# Patient Record
Sex: Female | Born: 1955 | Race: White | Hispanic: No | Smoking: Never smoker
Health system: Southern US, Community
[De-identification: ages and names within clinical notes are randomized; demographics above are authoritative.]

## PROBLEM LIST (undated history)

## (undated) DIAGNOSIS — J45909 Unspecified asthma, uncomplicated: Secondary | ICD-10-CM

## (undated) DIAGNOSIS — J189 Pneumonia, unspecified organism: Secondary | ICD-10-CM

## (undated) HISTORY — DX: Pneumonia, unspecified organism: J18.9

## (undated) HISTORY — DX: Unspecified asthma, uncomplicated: J45.909

---
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
## 1993-10-14 HISTORY — PX: BREAST BIOPSY: SHX20

## 2005-04-05 ENCOUNTER — Other Ambulatory Visit: Admission: RE | Admit: 2005-04-05 | Discharge: 2005-04-05 | Payer: Self-pay | Admitting: Obstetrics and Gynecology

## 2006-05-30 ENCOUNTER — Ambulatory Visit (HOSPITAL_COMMUNITY): Admission: RE | Admit: 2006-05-30 | Discharge: 2006-05-30 | Payer: Self-pay | Admitting: Obstetrics and Gynecology

## 2006-05-30 ENCOUNTER — Encounter (INDEPENDENT_AMBULATORY_CARE_PROVIDER_SITE_OTHER): Payer: Self-pay | Admitting: Specialist

## 2007-05-25 ENCOUNTER — Encounter: Admission: RE | Admit: 2007-05-25 | Discharge: 2007-05-25 | Payer: Self-pay | Admitting: Obstetrics and Gynecology

## 2008-08-03 ENCOUNTER — Encounter: Admission: RE | Admit: 2008-08-03 | Discharge: 2008-08-03 | Payer: Self-pay | Admitting: Obstetrics and Gynecology

## 2009-09-13 ENCOUNTER — Encounter: Admission: RE | Admit: 2009-09-13 | Discharge: 2009-09-13 | Payer: Self-pay | Admitting: Obstetrics and Gynecology

## 2011-03-01 NOTE — Op Note (Signed)
NAMESERRENA, LINDERMAN NO.:  [id number]   MEDICAL RECORD NO.:  [id number]          PATIENT TYPE:  AMB   LOCATION:  SDC                           FACILITY:  [facility]   PHYSICIAN:  Miguel Aschoff, M.D.       DATE OF BIRTH:  [birth year]   DATE OF PROCEDURE:  05/30/2006  DATE OF DISCHARGE:                                 OPERATIVE REPORT   PREOPERATIVE DIAGNOSIS:  Menorrhagia.   POSTOPERATIVE DIAGNOSIS:  Menorrhagia.   PROCEDURE:  1. Cervical dilatation.  2. Hysteroscopy.  3. Uterine curettage.  4. NovaSure endometrial ablation.   SURGEON:  Miguel Aschoff, M.D.   ANESTHESIA:  General.   COMPLICATIONS:  None.   JUSTIFICATION:  The patient is a 55 year old white female with a history of  menorrhagia, getting progressively worse and not responsive to medical  therapy.  The patient has expressed desire to undergo procedure in an effort  to reduce to eliminate her heavy vaginal bleeding and has given her informed  consent for NovaSure endometrial ablation.  The risks and benefits of this  procedure were discussed with the patient.   DESCRIPTION OF PROCEDURE:  The patient was taken to the operating room and  placed in the supine position and general anesthesia was administered  without difficulty.  She was then placed in the dorsal lithotomy position  and prepped and draped in the usual sterile fashion.  The bladder was  catheterized.  Examination revealed normal external genitalia, normal  Bartholin and Skene glands, and normal urethra.  The vaginal vault was  without gross lesion.  The cervix was without gross lesion.  The uterus  appeared to be normal size and shape, mid position.  No adnexal masses were  noted.   At this point a speculum was placed in the vaginal vault.  The anterior  cervical lip was grasped with a tenaculum, and then the uterine cavity was  sounded to 9 cm.  The cervical length of 3.5 cm was then established  __________ 5.5 cm.  Once this was done,  the diagnostic hysteroscope was  entered into the endocervical canal.  No lesions were noted in the canal.  The cavity was then inspected.  There were no significant __________ or  polyps found.  At this point the hysteroscopy was completed.  Uterine  curettage was carried out.  The tissue was removed and sent for histologic  study.  At this point the NovaSure endometrial ablation unit was advanced  through the cervix.  The array opened and a cavity width of 3.5 cm was  established.  After cavity assessment, a treatment cycle of 1 minute 17  seconds at 106 watts was carried out without difficulty.  At the completion  of the treatment cycle the hysteroscope was replaced into the uterine  cavity.  The cavity appeared to be blanched and totally ablated with the  treatment.   At this point, all instruments were removed.  Prior to removing the  speculum, the cervix was injected with 20 mL of 1% Xylocaine by placing  equal amounts in the 12, 4,  and 8 o'clock positions.  Endometrial curettings  were sent for histologic study.   The patient was then taken to the recovery room in satisfactory condition.  She will be discharged home.   MEDICATIONS FOR HOME INCLUDE:  1. Doxycycline 100 mg twice a day x3 days.  2. Darvocet-N 100 q.4 h. as needed for pain.   The patient is instructed to place nothing in the vagina.  To call if there  are any problems such as fever, pain, or heavy bleeding.  She will be seen  back in 4 weeks for her followup examination.      Miguel Aschoff, M.D.  Electronically Signed     AR/MEDQ  D:  05/30/2006  T:  05/30/2006  Job:  [id number]

## 2014-01-04 ENCOUNTER — Encounter: Payer: Self-pay | Admitting: Emergency Medicine

## 2014-01-04 ENCOUNTER — Ambulatory Visit (INDEPENDENT_AMBULATORY_CARE_PROVIDER_SITE_OTHER): Payer: BC Managed Care – PPO | Admitting: Emergency Medicine

## 2014-01-04 VITALS — BP 110/62 | HR 100 | Temp 98.6°F | Resp 16 | Ht 68.0 in | Wt 153.0 lb

## 2014-01-04 DIAGNOSIS — R0609 Other forms of dyspnea: Secondary | ICD-10-CM

## 2014-01-04 DIAGNOSIS — J189 Pneumonia, unspecified organism: Secondary | ICD-10-CM

## 2014-01-04 DIAGNOSIS — R0989 Other specified symptoms and signs involving the circulatory and respiratory systems: Secondary | ICD-10-CM

## 2014-01-04 DIAGNOSIS — R0600 Dyspnea, unspecified: Secondary | ICD-10-CM

## 2014-01-04 DIAGNOSIS — K219 Gastro-esophageal reflux disease without esophagitis: Secondary | ICD-10-CM

## 2014-01-04 MED ORDER — CEFTRIAXONE SODIUM 1 G IJ SOLR
1.0000 g | Freq: Once | INTRAMUSCULAR | Status: AC
  Administered 2014-01-04: 1 g via INTRAMUSCULAR

## 2014-01-04 MED ORDER — ALBUTEROL SULFATE HFA 108 (90 BASE) MCG/ACT IN AERS: 2.0000 | INHALATION_SPRAY | Freq: Four times a day (QID) | RESPIRATORY_TRACT | Status: DC | PRN

## 2014-01-04 MED ORDER — DOXYCYCLINE HYCLATE 100 MG PO TABS: 100.0000 mg | ORAL_TABLET | Freq: Two times a day (BID) | ORAL | Status: DC

## 2014-01-04 MED ORDER — PREDNISONE 10 MG PO TABS: ORAL_TABLET | ORAL | Status: DC

## 2014-01-04 NOTE — Patient Instructions (Signed)
Diet for Gastroesophageal Reflux Disease, Adult Reflux (acid reflux) is when acid from your stomach flows up into the esophagus. When acid comes in contact with the esophagus, the acid causes irritation and soreness (inflammation) in the esophagus. When reflux happens often or so severely that it causes damage to the esophagus, it is called gastroesophageal reflux disease (GERD). Nutrition therapy can help ease the discomfort of GERD. FOODS OR DRINKS TO AVOID OR LIMIT  Smoking or chewing tobacco. Nicotine is one of the most potent stimulants to acid production in the gastrointestinal tract.  Caffeinated and decaffeinated coffee and black tea.  Regular or low-calorie carbonated beverages or energy drinks (caffeine-free carbonated beverages are allowed).   Strong spices, such as black pepper, white pepper, red pepper, cayenne, curry powder, and chili powder.  Peppermint or spearmint.  Chocolate.  High-fat foods, including meats and fried foods. Extra added fats including oils, butter, salad dressings, and nuts. Limit these to less than 8 tsp per day.  Fruits and vegetables if they are not tolerated, such as citrus fruits or tomatoes.  Alcohol.  Any food that seems to aggravate your condition. If you have questions regarding your diet, call your caregiver or a registered dietitian. OTHER THINGS THAT MAY HELP GERD INCLUDE:   Eating your meals slowly, in a relaxed setting.  Eating 5 to 6 small meals per day instead of 3 large meals.  Eliminating food for a period of time if it causes distress.  Not lying down until 3 hours after eating a meal.  Keeping the head of your bed raised 6 to 9 inches (15 to 23 cm) by using a foam wedge or blocks under the legs of the bed. Lying flat may make symptoms worse.  Being physically active. Weight loss may be helpful in reducing reflux in overweight or obese adults.  Wear loose fitting clothing EXAMPLE MEAL PLAN This meal plan is approximately  2,000 calories based on https://www.bernard.org/ meal planning guidelines. Breakfast   cup cooked oatmeal.  1 cup strawberries.  1 cup low-fat milk.  1 oz almonds. Snack  1 cup cucumber slices.  6 oz yogurt (made from low-fat or fat-free milk). Lunch  2 slice whole-wheat bread.  2 oz sliced [place].  2 tsp mayonnaise.  1 cup blueberries.  1 cup snap peas. Snack  6 whole-wheat crackers.  1 oz string cheese. Dinner   cup brown rice.  1 cup mixed veggies.  1 tsp olive oil.  3 oz grilled fish. Document Released: 09/30/2005 Document Revised: 12/23/2011 Document Reviewed: 08/16/2011 [facility] Patient Information 2014 [place], [place]. Pneumonia, Adult Pneumonia is an infection of the lungs. It may be caused by a germ (virus or bacteria). Some types of pneumonia can spread easily from person to person. This can happen when you cough or sneeze. HOME CARE  Only take medicine as told by your doctor.  Take your medicine (antibiotics) as told. Finish it even if you start to feel better.  Do not smoke.  You may use a vaporizer or humidifier in your room. This can help loosen thick spit (mucus).  Sleep so you are almost sitting up (semi-upright). This helps reduce coughing.  Rest. A shot (vaccine) can help prevent pneumonia. Shots are often advised for:  People over 9 years old.  Patients on chemotherapy.  People with long-term (chronic) lung problems.  People with immune system problems. GET HELP RIGHT AWAY IF:   You are getting worse.  You cannot control your cough, and you are losing sleep.  You cough up blood.  Your pain gets worse, even with medicine.  You have a fever.  Any of your problems are getting worse, not better.  You have shortness of breath or chest pain. MAKE SURE YOU:   Understand these instructions.  Will watch your condition.  Will get help right away if you are not doing well or get worse. Document Released: 03/18/2008 Document  Revised: 12/23/2011 Document Reviewed: 12/21/2010 [facility] Patient Information 2014 [place], [place].

## 2014-01-04 NOTE — Progress Notes (Signed)
   Subjective:    Patient ID: Holly Mclean, female    DOB: 1956-08-23, 58 y.o.   MRN: [id number]  HPI Comments: 58 yo WF new patient to establish care needs re-eval for pneumonia. She notes 2 separate xrays at urgent care for CXR with ? Pneumonia. She was treated with ZPAK and cough syrup/ NOse spray w/o relief. She is still with productive cough Green/ Brown. She notes symptoms started after terrible bout of reflux and sorethroat.  She notes mild discomfort in chest with cough.  History was reviewed.   Cough Associated symptoms include shortness of breath.  Shortness of Breath     Medication List       This list is accurate as of: 01/04/14  3:01 PM.  Always use your most recent med list.               brompheniramine-pseudoephedrine-DM 30-2-10 MG/5ML syrup  Take 10 mLs by mouth 4 (four) times daily as needed.          Review of Systems  Constitutional: Positive for fatigue.  HENT: Positive for congestion.   Respiratory: Positive for cough and shortness of breath.   Gastrointestinal:       Reflux  All other systems reviewed and are negative.   BP 110/62  Pulse 100  Temp(Src) 98.6 F (37 C) (Temporal)  Resp 16  Ht 5\' 8"  (1.727 m)  Wt 153 lb (69.4 kg)  BMI 23.27 kg/m2  SpO2 98%     Objective:   Physical Exam  Nursing note and vitals reviewed. Constitutional: She is oriented to person, place, and time. She appears well-developed and well-nourished. No distress.  HENT:  Head: Normocephalic and atraumatic.  Right Ear: External ear normal.  Left Ear: External ear normal.  Nose: Nose normal.  Mouth/Throat: Oropharynx is clear and moist. No oropharyngeal exudate.  Eyes: Conjunctivae and EOM are normal.  Neck: Normal range of motion. Neck supple. No JVD present. No thyromegaly present.  Cardiovascular: Normal rate, regular rhythm, normal heart sounds and intact distal pulses.   Pulmonary/Chest: Effort normal.  ? Rhonchi  Abdominal: Soft. Bowel sounds are  normal. She exhibits no distension and no mass. There is no tenderness. There is no rebound and no guarding.  Musculoskeletal: Normal range of motion. She exhibits no edema and no tenderness.  Lymphadenopathy:    She has no cervical adenopathy.  Neurological: She is alert and oriented to person, place, and time. No cranial nerve deficit.  Skin: Skin is warm and dry. No rash noted. No erythema. No pallor.  Psychiatric: She has a normal mood and affect. Her behavior is normal. Judgment and thought content normal.          Assessment & Plan:  1. Concern for aspiration pneumonia-Rocephin 1 gm in office, Add Doxy 100 mg, Albuterol Hfa, Prednisone DP 10 mg, and Prilosec All AD. F/u 2 days with results may need repeat XRAY vs ER.

## 2014-01-05 ENCOUNTER — Encounter: Payer: Self-pay | Admitting: Emergency Medicine

## 2014-01-05 DIAGNOSIS — J189 Pneumonia, unspecified organism: Secondary | ICD-10-CM

## 2014-01-05 HISTORY — DX: Pneumonia, unspecified organism: J18.9

## 2014-04-11 ENCOUNTER — Ambulatory Visit (INDEPENDENT_AMBULATORY_CARE_PROVIDER_SITE_OTHER): Payer: BC Managed Care – PPO | Admitting: Emergency Medicine

## 2014-04-11 ENCOUNTER — Encounter: Payer: Self-pay | Admitting: Emergency Medicine

## 2014-04-11 ENCOUNTER — Other Ambulatory Visit: Payer: Self-pay | Admitting: Emergency Medicine

## 2014-04-11 VITALS — BP 104/68 | HR 66 | Temp 98.1°F | Resp 16 | Ht 68.5 in | Wt 157.0 lb

## 2014-04-11 DIAGNOSIS — Z79899 Other long term (current) drug therapy: Secondary | ICD-10-CM

## 2014-04-11 DIAGNOSIS — N390 Urinary tract infection, site not specified: Secondary | ICD-10-CM

## 2014-04-11 DIAGNOSIS — Z111 Encounter for screening for respiratory tuberculosis: Secondary | ICD-10-CM

## 2014-04-11 DIAGNOSIS — Z1211 Encounter for screening for malignant neoplasm of colon: Secondary | ICD-10-CM

## 2014-04-11 DIAGNOSIS — R05 Cough: Secondary | ICD-10-CM

## 2014-04-11 DIAGNOSIS — R6889 Other general symptoms and signs: Secondary | ICD-10-CM

## 2014-04-11 DIAGNOSIS — R202 Paresthesia of skin: Secondary | ICD-10-CM

## 2014-04-11 DIAGNOSIS — Z23 Encounter for immunization: Secondary | ICD-10-CM

## 2014-04-11 DIAGNOSIS — Z780 Asymptomatic menopausal state: Secondary | ICD-10-CM

## 2014-04-11 DIAGNOSIS — R059 Cough, unspecified: Secondary | ICD-10-CM

## 2014-04-11 DIAGNOSIS — Z0000 Encounter for general adult medical examination without abnormal findings: Secondary | ICD-10-CM

## 2014-04-11 DIAGNOSIS — E559 Vitamin D deficiency, unspecified: Secondary | ICD-10-CM

## 2014-04-11 DIAGNOSIS — I10 Essential (primary) hypertension: Secondary | ICD-10-CM

## 2014-04-11 LAB — CBC WITH DIFFERENTIAL/PLATELET
BASOS ABS: 0.1 10*3/uL (ref 0.0–0.1)
BASOS PCT: 1 % (ref 0–1)
EOS ABS: 0.1 10*3/uL (ref 0.0–0.7)
Eosinophils Relative: 2 % (ref 0–5)
HCT: 37.4 % (ref 36.0–46.0)
HEMOGLOBIN: 12.7 g/dL (ref 12.0–15.0)
LYMPHS PCT: 56 % — AB (ref 12–46)
Lymphs Abs: 2.9 10*3/uL (ref 0.7–4.0)
MCH: 29.5 pg (ref 26.0–34.0)
MCHC: 34 g/dL (ref 30.0–36.0)
MCV: 86.8 fL (ref 78.0–100.0)
MONO ABS: 0.2 10*3/uL (ref 0.1–1.0)
MONOS PCT: 3 % (ref 3–12)
NEUTROS PCT: 38 % — AB (ref 43–77)
Neutro Abs: 2 10*3/uL (ref 1.7–7.7)
Platelets: 241 10*3/uL (ref 150–400)
RBC: 4.31 MIL/uL (ref 3.87–5.11)
RDW: 13.7 % (ref 11.5–15.5)
WBC: 5.2 10*3/uL (ref 4.0–10.5)

## 2014-04-11 LAB — HEMOGLOBIN A1C
Hgb A1c MFr Bld: 5.5 % (ref <5.7)
MEAN PLASMA GLUCOSE: 111 mg/dL (ref <117)

## 2014-04-11 MED ORDER — MONTELUKAST SODIUM 10 MG PO TABS: 10.0000 mg | ORAL_TABLET | Freq: Every day | ORAL | Status: DC

## 2014-04-11 NOTE — Patient Instructions (Addendum)
Cough, Adult Trial of Singulair x 2 weeks if no change with cough/ raspy voice needs GI evaluation  A cough is a reflex. It helps you clear your throat and airways. A cough can help heal your body. A cough can last 2 or 3 weeks (acute) or may last more than 8 weeks (chronic). Some common causes of a cough can include an infection, allergy, or a cold. HOME CARE  Only take medicine as told by your doctor.  If given, take your medicines (antibiotics) as told. Finish them even if you start to feel better.  Use a cold steam vaporizer or humidier in your home. This can help loosen thick spit (secretions).  Sleep so you are almost sitting up (semi-upright). Use pillows to do this. This helps reduce coughing.  Rest as needed.  Stop smoking if you smoke. GET HELP RIGHT AWAY IF:  You have yellowish-white fluid (pus) in your thick spit.  Your cough gets worse.  Your medicine does not reduce coughing, and you are losing sleep.  You cough up blood.  You have trouble breathing.  Your pain gets worse and medicine does not help.  You have a fever. MAKE SURE YOU:   Understand these instructions.  Will watch your condition.  Will get help right away if you are not doing well or get worse. Document Released: 06/13/2011 Document Revised: 12/23/2011 Document Reviewed: 06/13/2011 [facility] Patient Information 2015 [place], [place]. This information is not intended to replace advice given to you by your health care provider. Make sure you discuss any questions you have with your health care provider.   Food Choices for Gastroesophageal Reflux Disease When you have gastroesophageal reflux disease (GERD), the foods you eat and your eating habits are very important. Choosing the right foods can help ease the discomfort of GERD. WHAT GENERAL GUIDELINES DO I NEED TO FOLLOW?  Choose fruits, vegetables, whole grains, low-fat dairy products, and low-fat meat, fish, and poultry.  Limit fats such as  oils, salad dressings, butter, nuts, and avocado.  Keep a food diary to identify foods that cause symptoms.  Avoid foods that cause reflux. These may be different for different people.  Eat frequent small meals instead of three large meals each day.  Eat your meals slowly, in a relaxed setting.  Limit fried foods.  Cook foods using methods other than frying.  Avoid drinking alcohol.  Avoid drinking large amounts of liquids with your meals.  Avoid bending over or lying down until 2-3 hours after eating. WHAT FOODS ARE NOT RECOMMENDED? The following are some foods and drinks that may worsen your symptoms: Vegetables Tomatoes. Tomato juice. Tomato and spaghetti sauce. Chili peppers. Onion and garlic. Horseradish. Fruits Oranges, grapefruit, and lemon (fruit and juice). Meats High-fat meats, fish, and poultry. This includes hot dogs, ribs, ham, sausage, salami, and bacon. Dairy Whole milk and chocolate milk. Sour cream. Cream. Butter. Ice cream. Cream cheese.  Beverages Coffee and tea, with or without caffeine. Carbonated beverages or energy drinks. Condiments Hot sauce. Barbecue sauce.  Sweets/Desserts Chocolate and cocoa. Donuts. Peppermint and spearmint. Fats and Oils High-fat foods, including [place] fries and potato chips. Other Vinegar. Strong spices, such as black pepper, white pepper, red pepper, cayenne, curry powder, cloves, ginger, and chili powder. The items listed above may not be a complete list of foods and beverages to avoid. Contact your dietitian for more information. Document Released: 09/30/2005 Document Revised: 10/05/2013 Document Reviewed: 08/04/2013 [facility] Patient Information 2015 [place], [place]. This information is not intended  to replace advice given to you by your health care provider. Make sure you discuss any questions you have with your health care provider.  

## 2014-04-11 NOTE — Progress Notes (Signed)
Subjective:    Patient ID: Holly HansenVickie L Mclean, female    DOB: [birth year], 58 y.o.   MRN: [id number]  HPI Comments: 58 yo WF CPE. She has history of asthma as child and concerns for questionable bronchitis/ pneumonia. She notes chronic hoarseness but denies reflux/ allergy drainage. She notes inhaler from previous infection helped with symptoms of chest tightness.   She notes numbness in hands only when rides bike. She is riding routinely for exercise. She denies any injury/ strain.  She is eating healthy for the most part. She is exercising routinely    Medication List       This list is accurate as of: 04/11/14 10:30 AM.  Always use your most recent med list.               albuterol 108 (90 BASE) MCG/ACT inhaler  Commonly known as:  PROVENTIL HFA;VENTOLIN HFA  Inhale 2 puffs into the lungs every 6 (six) hours as needed for wheezing or shortness of breath.       No Known Allergies Past Medical History  Diagnosis Date  . Pneumonia 01/05/2014  . Asthma     child hood   Past Surgical History  Procedure Laterality Date  . Hernia repair      age 89  . Appendectomy    . Tonsillectomy     History  Substance Use Topics  . Smoking status: Never Smoker   . Smokeless tobacco: Not on file  . Alcohol Use: No   Family History  Problem Relation Age of Onset  . Hypertension Father   . Hyperlipidemia Father   . Heart disease Father   . Lupus Maternal Grandmother   . Heart disease Maternal Grandfather   . Heart disease Paternal Grandfather     MAINTENANCE: Colonoscopy: over due Mammo:2013 [facility] WNL BMD: over due Pap/ Pelvic:2013 WNL EYE: 2014 WNL, glasses Dentist:Q 6 month  IMMUNIZATIONS: Tdap:? Pneumovax:2008 Zostavax: Influenza:  Patient Care Team: Lucky CowboyWilliam McKeown, MD as PCP - General (Internal Medicine) [facility] Bjorn LoserMarsha Gruber, MD as Consulting Physician (Dermatology) [place] [facility] Evelena AsaGunner Hayes, (Dentist)   Review of Systems  HENT: Positive  for voice change.   Respiratory: Positive for cough.   Neurological: Positive for numbness.  All other systems reviewed and are negative.  BP 104/68  Pulse 66  Temp(Src) 98.1 F (36.7 C)  Resp 16  Ht 5' 8.5" (1.74 m)  Wt 157 lb (71.215 kg)  BMI 23.52 kg/m2     Objective:   Physical Exam  Nursing note and vitals reviewed. Constitutional: She is oriented to person, place, and time. She appears well-developed and well-nourished. No distress.  HENT:  Head: Normocephalic and atraumatic.  Right Ear: External ear normal.  Left Ear: External ear normal.  Nose: Nose normal.  Mouth/Throat: Oropharynx is clear and moist.  Eyes: Conjunctivae and EOM are normal. Pupils are equal, round, and reactive to light. Right eye exhibits no discharge. Left eye exhibits no discharge. No scleral icterus.  Neck: Normal range of motion. Neck supple. No JVD present. No tracheal deviation present. No thyromegaly present.  Cardiovascular: Normal rate, regular rhythm, normal heart sounds and intact distal pulses.   Pulmonary/Chest: Effort normal and breath sounds normal.  Abdominal: Soft. Bowel sounds are normal. She exhibits no distension and no mass. There is no tenderness. There is no rebound and no guarding.  Genitourinary:  Def GYn  Musculoskeletal: Normal range of motion. She exhibits no edema and no tenderness.  Lymphadenopathy:  She has no cervical adenopathy.  Neurological: She is alert and oriented to person, place, and time. She has normal reflexes. No cranial nerve deficit. She exhibits normal muscle tone. Coordination normal.  Skin: Skin is warm and dry. No rash noted. No erythema. No pallor.  Psychiatric: She has a normal mood and affect. Her behavior is normal. Judgment and thought content normal.     EKG RSR' v1 WNL Aorta scan ? abnormality      Assessment & Plan:  1. CPE- Update screening labs/ History/ Immunizations/ Testing as needed. Advised healthy diet, QD exercise, increase  H20 and continue RX/ Vitamins AD. Need to get U/S Abdomen to evaluate abnormal scan  2. Cough- ? ASthma hx vs reflux vs allergies- Get CXR Trial of Singulair x 2 weeks if no change with cough/ raspy voice needs GI evaluation  3. Hand numbness- Check labs, get bike grips adjusted

## 2014-04-12 ENCOUNTER — Encounter: Payer: Self-pay | Admitting: Gastroenterology

## 2014-04-12 ENCOUNTER — Encounter: Payer: Self-pay | Admitting: Emergency Medicine

## 2014-04-12 ENCOUNTER — Other Ambulatory Visit: Payer: Self-pay | Admitting: Emergency Medicine

## 2014-04-12 DIAGNOSIS — Z1231 Encounter for screening mammogram for malignant neoplasm of breast: Secondary | ICD-10-CM

## 2014-04-12 LAB — BASIC METABOLIC PANEL WITH GFR
BUN: 16 mg/dL (ref 6–23)
CALCIUM: 9.7 mg/dL (ref 8.4–10.5)
CO2: 29 meq/L (ref 19–32)
Chloride: 103 mEq/L (ref 96–112)
Creat: 0.63 mg/dL (ref 0.50–1.10)
GFR, Est African American: >89 mL/min
GLUCOSE: 84 mg/dL (ref 70–99)
Potassium: 4.5 mEq/L (ref 3.5–5.3)
SODIUM: 140 meq/L (ref 135–145)

## 2014-04-12 LAB — URINALYSIS, MICROSCOPIC ONLY
BACTERIA UA: NONE SEEN
CASTS: NONE SEEN
CRYSTALS: NONE SEEN
SQUAMOUS EPITHELIAL / LPF: NONE SEEN

## 2014-04-12 LAB — HEPATIC FUNCTION PANEL
ALT: 17 U/L (ref 0–35)
AST: 22 U/L (ref 0–37)
Albumin: 4.2 g/dL (ref 3.5–5.2)
Alkaline Phosphatase: 88 U/L (ref 39–117)
BILIRUBIN TOTAL: 0.6 mg/dL (ref 0.2–1.2)
Bilirubin, Direct: 0.1 mg/dL (ref 0.0–0.3)
Indirect Bilirubin: 0.5 mg/dL (ref 0.2–1.2)
TOTAL PROTEIN: 7.3 g/dL (ref 6.0–8.3)

## 2014-04-12 LAB — URINALYSIS, ROUTINE W REFLEX MICROSCOPIC
Bilirubin Urine: NEGATIVE
Glucose, UA: NEGATIVE mg/dL
Hgb urine dipstick: NEGATIVE
KETONES UR: NEGATIVE mg/dL
NITRITE: POSITIVE — AB
PROTEIN: NEGATIVE mg/dL
Specific Gravity, Urine: 1.019 (ref 1.005–1.030)
Urobilinogen, UA: 0.2 mg/dL (ref 0.0–1.0)
pH: 6 (ref 5.0–8.0)

## 2014-04-12 LAB — LIPID PANEL
CHOLESTEROL: 218 mg/dL — AB (ref 0–200)
HDL: 57 mg/dL (ref >39)
LDL Cholesterol: 144 mg/dL — ABNORMAL HIGH (ref 0–99)
Total CHOL/HDL Ratio: 3.8 Ratio
Triglycerides: 84 mg/dL (ref <150)
VLDL: 17 mg/dL (ref 0–40)

## 2014-04-12 LAB — IRON AND TIBC
%SAT: 17 % — AB (ref 20–55)
Iron: 64 ug/dL (ref 42–145)
TIBC: 373 ug/dL (ref 250–470)
UIBC: 309 ug/dL (ref 125–400)

## 2014-04-12 LAB — VITAMIN D 25 HYDROXY (VIT D DEFICIENCY, FRACTURES): Vit D, 25-Hydroxy: 31 ng/mL (ref 30–89)

## 2014-04-12 LAB — MAGNESIUM: Magnesium: 2 mg/dL (ref 1.5–2.5)

## 2014-04-12 LAB — MICROALBUMIN / CREATININE URINE RATIO
Creatinine, Urine: 144.8 mg/dL
MICROALB UR: 0.59 mg/dL (ref 0.00–1.89)
Microalb Creat Ratio: 4.1 mg/g (ref 0.0–30.0)

## 2014-04-12 LAB — INSULIN, FASTING: INSULIN FASTING, SERUM: 7 u[IU]/mL (ref 3–28)

## 2014-04-12 LAB — FOLATE RBC: RBC Folate: 506 ng/mL (ref >280)

## 2014-04-12 LAB — TSH: TSH: 3.83 u[IU]/mL (ref 0.350–4.500)

## 2014-04-12 LAB — VITAMIN B12: VITAMIN B 12: 796 pg/mL (ref 211–911)

## 2014-04-14 ENCOUNTER — Other Ambulatory Visit: Payer: Self-pay | Admitting: Emergency Medicine

## 2014-04-14 LAB — URINE CULTURE: Colony Count: >=100000

## 2014-04-14 LAB — TB SKIN TEST
Induration: 0 mm
TB Skin Test: NEGATIVE

## 2014-04-14 MED ORDER — CIPROFLOXACIN HCL 500 MG PO TABS: 500.0000 mg | ORAL_TABLET | Freq: Two times a day (BID) | ORAL | Status: AC

## 2014-04-19 ENCOUNTER — Ambulatory Visit
Admission: RE | Admit: 2014-04-19 | Discharge: 2014-04-19 | Disposition: A | Discharge status: Home / Self Care | Payer: BC Managed Care – PPO | Source: Ambulatory Visit | Attending: Emergency Medicine | Admitting: Emergency Medicine

## 2014-04-19 ENCOUNTER — Other Ambulatory Visit: Payer: Self-pay | Admitting: Emergency Medicine

## 2014-04-19 DIAGNOSIS — R6889 Other general symptoms and signs: Secondary | ICD-10-CM

## 2014-04-22 ENCOUNTER — Ambulatory Visit
Admission: RE | Admit: 2014-04-22 | Discharge: 2014-04-22 | Disposition: A | Discharge status: Home / Self Care | Payer: BC Managed Care – PPO | Source: Ambulatory Visit | Attending: Emergency Medicine | Admitting: Emergency Medicine

## 2014-04-22 DIAGNOSIS — Z780 Asymptomatic menopausal state: Secondary | ICD-10-CM

## 2014-04-22 DIAGNOSIS — Z1231 Encounter for screening mammogram for malignant neoplasm of breast: Secondary | ICD-10-CM

## 2014-04-24 ENCOUNTER — Other Ambulatory Visit: Payer: Self-pay | Admitting: Emergency Medicine

## 2014-04-24 MED ORDER — ALENDRONATE SODIUM 70 MG PO TABS: 70.0000 mg | ORAL_TABLET | ORAL | Status: DC

## 2014-04-27 ENCOUNTER — Encounter: Payer: Self-pay | Admitting: Gastroenterology

## 2014-04-28 ENCOUNTER — Other Ambulatory Visit: Payer: Self-pay | Admitting: Obstetrics and Gynecology

## 2014-04-29 LAB — CYTOLOGY - PAP

## 2014-05-02 ENCOUNTER — Encounter: Payer: Self-pay | Admitting: *Deleted

## 2014-06-13 ENCOUNTER — Encounter: Payer: BC Managed Care – PPO | Admitting: Gastroenterology

## 2014-06-24 ENCOUNTER — Ambulatory Visit (AMBULATORY_SURGERY_CENTER): Payer: Self-pay | Admitting: *Deleted

## 2014-06-24 VITALS — Ht 68.0 in | Wt 156.6 lb

## 2014-06-24 DIAGNOSIS — Z1211 Encounter for screening for malignant neoplasm of colon: Secondary | ICD-10-CM

## 2014-06-24 MED ORDER — MOVIPREP 100 G PO SOLR: ORAL | Status: DC

## 2014-06-24 NOTE — Progress Notes (Signed)
No allergies to eggs or soy. No problems with anesthesia.  Pt given Emmi instructions for colonoscopy  No oxygen use  No diet drug use  

## 2014-06-29 ENCOUNTER — Encounter: Payer: Self-pay | Admitting: Gastroenterology

## 2014-07-15 ENCOUNTER — Encounter: Payer: Self-pay | Admitting: Gastroenterology

## 2014-07-15 ENCOUNTER — Ambulatory Visit (AMBULATORY_SURGERY_CENTER): Payer: BC Managed Care – PPO | Admitting: Gastroenterology

## 2014-07-15 VITALS — BP 113/77 | HR 63 | Temp 98.4°F | Resp 14 | Ht 68.0 in | Wt 156.0 lb

## 2014-07-15 DIAGNOSIS — Z1211 Encounter for screening for malignant neoplasm of colon: Secondary | ICD-10-CM

## 2014-07-15 MED ORDER — SODIUM CHLORIDE 0.9 % IV SOLN: 500.0000 mL | INTRAVENOUS | Status: DC

## 2014-07-15 NOTE — Op Note (Signed)
[facility] [address] [place], [id number]   COLONOSCOPY PROCEDURE REPORT  PATIENT: Holly HansenReid, Kip L  MR#: [id number] BIRTHDATE: Sep 28, 1956 , 58  yrs. old GENDER: female ENDOSCOPIST: Meryl DareMalcolm T Merita Hawks, MD, [facility] REFERRED XB:JYNWGNFBY:McKeown, William PROCEDURE DATE:  07/15/2014 PROCEDURE:   Colonoscopy, screening First Screening Colonoscopy - Avg.  risk and is 50 yrs.  old or older Yes.  Prior Negative Screening - Now for repeat screening. N/A  History of Adenoma - Now for follow-up colonoscopy & has been > or = to 3 yrs.  N/A  Polyps Removed Today? No.  Polyps Removed Today? No.  Recommend repeat exam, <10 yrs? Polyps Removed Today? No.  Recommend repeat exam, <10 yrs? No. ASA CLASS:   Class II INDICATIONS:average risk for colorectal cancer. MEDICATIONS: Monitored anesthesia care and Propofol 200 mg IV DESCRIPTION OF PROCEDURE:   After the risks benefits and alternatives of the procedure were thoroughly explained, informed consent was obtained.  The digital rectal exam revealed no abnormalities of the rectum.   The LB [id number]  endoscope was introduced through the anus and advanced to the cecum, which was identified by both the appendix and ileocecal valve. No adverse events experienced.   The quality of the prep was excellent, using MoviPrep  The instrument was then slowly withdrawn as the colon was fully examined.    COLON FINDINGS: A normal appearing cecum, ileocecal valve, and appendiceal orifice were identified.  The ascending, transverse, descending, sigmoid colon, and rectum appeared unremarkable. Retroflexed views revealed no abnormalities. The time to cecum=3 minutes 29 seconds.  Withdrawal time=9 minutes 10 seconds.  The scope was withdrawn and the procedure completed.  COMPLICATIONS: There were no immediate complications.  ENDOSCOPIC IMPRESSION: 1.  Normal colonoscopy  RECOMMENDATIONS: 1.  Continue to follow colorectal cancer screening guidelines  for "routine risk" patients with a repeat colonoscopy in 10 years. There is no need for routine, screening FOBT (stool) testing for at least 5 years.  eSigned:  Meryl DareMalcolm T Michelyn Scullin, MD, [facility]FACG 07/15/2014 10:09 AM

## 2014-07-15 NOTE — Progress Notes (Signed)
Stable to RR 

## 2014-07-15 NOTE — Patient Instructions (Signed)
YOU HAD AN ENDOSCOPIC PROCEDURE TODAY AT THE [facility]: Refer to the procedure report that was given to you for any specific questions about what was found during the examination.  If the procedure report does not answer your questions, please call your gastroenterologist to clarify.  If you requested that your care partner not be given the details of your procedure findings, then the procedure report has been included in a sealed envelope for you to review at your convenience later.  YOU SHOULD EXPECT: Some feelings of bloating in the abdomen. Passage of more gas than usual.  Walking can help get rid of the air that was put into your GI tract during the procedure and reduce the bloating. If you had a lower endoscopy (such as a colonoscopy or flexible sigmoidoscopy) you may notice spotting of blood in your stool or on the toilet paper. If you underwent a bowel prep for your procedure, then you may not have a normal bowel movement for a few days.  DIET: Your first meal following the procedure should be a light meal and then it is ok to progress to your normal diet.  A half-sandwich or bowl of soup is an example of a good first meal.  Heavy or fried foods are harder to digest and may make you feel nauseous or bloated.  Likewise meals heavy in dairy and vegetables can cause extra gas to form and this can also increase the bloating.  Drink plenty of fluids but you should avoid alcoholic beverages for 24 hours.  ACTIVITY: Your care partner should take you home directly after the procedure.  You should plan to take it easy, moving slowly for the rest of the day.  You can resume normal activity the day after the procedure however you should NOT DRIVE or use heavy machinery for 24 hours (because of the sedation medicines used during the test).    SYMPTOMS TO REPORT IMMEDIATELY: A gastroenterologist can be reached at any hour.  During normal business hours, 8:30 AM to 5:00 PM Monday through Friday,  call [phone].  After hours and on weekends, please call the GI answering service at [phone] who will take a message and have the physician on call contact you.   Following lower endoscopy (colonoscopy or flexible sigmoidoscopy):  Excessive amounts of blood in the stool  Significant tenderness or worsening of abdominal pains  Swelling of the abdomen that is new, acute  Fever of 100F or higher  FOLLOW UP: If any biopsies were taken you will be contacted by phone or by letter within the next 1-3 weeks.  Call your gastroenterologist if you have not heard about the biopsies in 3 weeks.  Our staff will call the home number listed on your records the next business day following your procedure to check on you and address any questions or concerns that you may have at that time regarding the information given to you following your procedure. This is a courtesy call and so if there is no answer at the home number and we have not heard from you through the emergency physician on call, we will assume that you have returned to your regular daily activities without incident.  SIGNATURES/CONFIDENTIALITY: You and/or your care partner have signed paperwork which will be entered into your electronic medical record.  These signatures attest to the fact that that the information above on your After Visit Summary has been reviewed and is understood.  Full responsibility of the confidentiality of this   discharge information lies with you and/or your care-partner.  Recommendations Next colonoscopy in 10 years. 

## 2014-07-18 ENCOUNTER — Telehealth: Payer: Self-pay | Admitting: *Deleted

## 2014-07-18 NOTE — Telephone Encounter (Signed)
  Follow up Call-  Call back number 07/15/2014  Post procedure Call Back phone  # [phone]  Permission to leave phone message Yes     Patient questions:  Do you have a fever, pain , or abdominal swelling? No. Pain Score  0 *  Have you tolerated food without any problems? Yes.    Have you been able to return to your normal activities? Yes.    Do you have any questions about your discharge instructions: Diet   No. Medications  No. Follow up visit  No.  Do you have questions or concerns about your Care? No.  Actions: * If pain score is 4 or above: No action needed, pain <4.

## 2014-07-22 ENCOUNTER — Ambulatory Visit: Payer: Self-pay | Admitting: Physician Assistant

## 2015-04-12 ENCOUNTER — Encounter: Payer: Self-pay | Admitting: Emergency Medicine

## 2015-04-13 ENCOUNTER — Encounter: Payer: Self-pay | Admitting: Physician Assistant

## 2016-01-12 ENCOUNTER — Other Ambulatory Visit: Payer: Self-pay

## 2016-01-12 DIAGNOSIS — Z1231 Encounter for screening mammogram for malignant neoplasm of breast: Secondary | ICD-10-CM

## 2016-01-29 ENCOUNTER — Ambulatory Visit
Admission: RE | Admit: 2016-01-29 | Discharge: 2016-01-29 | Disposition: A | Discharge status: Home / Self Care | Payer: BLUE CROSS/BLUE SHIELD | Source: Ambulatory Visit

## 2016-01-29 DIAGNOSIS — Z1231 Encounter for screening mammogram for malignant neoplasm of breast: Secondary | ICD-10-CM

## 2020-07-04 ENCOUNTER — Other Ambulatory Visit: Payer: Self-pay | Admitting: Internal Medicine

## 2020-07-04 DIAGNOSIS — Z1231 Encounter for screening mammogram for malignant neoplasm of breast: Secondary | ICD-10-CM

## 2020-07-05 ENCOUNTER — Other Ambulatory Visit: Payer: Self-pay

## 2020-07-05 ENCOUNTER — Ambulatory Visit
Admission: RE | Admit: 2020-07-05 | Discharge: 2020-07-05 | Disposition: A | Discharge status: Home / Self Care | Payer: BC Managed Care – PPO | Source: Ambulatory Visit | Attending: Internal Medicine | Admitting: Internal Medicine

## 2020-07-05 DIAGNOSIS — Z1231 Encounter for screening mammogram for malignant neoplasm of breast: Secondary | ICD-10-CM

## 2021-05-22 ENCOUNTER — Other Ambulatory Visit: Payer: Self-pay | Admitting: Internal Medicine

## 2021-05-22 DIAGNOSIS — Z1231 Encounter for screening mammogram for malignant neoplasm of breast: Secondary | ICD-10-CM

## 2021-06-29 DIAGNOSIS — H2513 Age-related nuclear cataract, bilateral: Secondary | ICD-10-CM | POA: Diagnosis not present

## 2021-06-29 DIAGNOSIS — H43393 Other vitreous opacities, bilateral: Secondary | ICD-10-CM | POA: Diagnosis not present

## 2021-07-12 ENCOUNTER — Ambulatory Visit
Admission: RE | Admit: 2021-07-12 | Discharge: 2021-07-12 | Disposition: A | Discharge status: Home / Self Care | Payer: Medicare Other | Source: Ambulatory Visit

## 2021-07-12 ENCOUNTER — Other Ambulatory Visit: Payer: Self-pay

## 2021-07-12 DIAGNOSIS — Z1231 Encounter for screening mammogram for malignant neoplasm of breast: Secondary | ICD-10-CM

## 2022-04-02 ENCOUNTER — Encounter: Payer: Self-pay | Admitting: Nurse Practitioner

## 2022-04-02 ENCOUNTER — Ambulatory Visit (INDEPENDENT_AMBULATORY_CARE_PROVIDER_SITE_OTHER): Payer: Medicare Other | Admitting: Nurse Practitioner

## 2022-04-02 VITALS — BP 152/98 | HR 78 | Temp 96.8°F | Wt 191.0 lb

## 2022-04-02 DIAGNOSIS — I10 Essential (primary) hypertension: Secondary | ICD-10-CM

## 2022-04-02 DIAGNOSIS — E559 Vitamin D deficiency, unspecified: Secondary | ICD-10-CM

## 2022-04-02 DIAGNOSIS — Z1329 Encounter for screening for other suspected endocrine disorder: Secondary | ICD-10-CM | POA: Diagnosis not present

## 2022-04-02 DIAGNOSIS — Z131 Encounter for screening for diabetes mellitus: Secondary | ICD-10-CM

## 2022-04-02 DIAGNOSIS — E538 Deficiency of other specified B group vitamins: Secondary | ICD-10-CM

## 2022-04-02 DIAGNOSIS — E669 Obesity, unspecified: Secondary | ICD-10-CM | POA: Diagnosis not present

## 2022-04-02 DIAGNOSIS — R7309 Other abnormal glucose: Secondary | ICD-10-CM | POA: Diagnosis not present

## 2022-04-02 DIAGNOSIS — F419 Anxiety disorder, unspecified: Secondary | ICD-10-CM

## 2022-04-02 LAB — CBC WITH DIFFERENTIAL/PLATELET
Absolute Monocytes: 282 cells/uL (ref 200–950)
Eosinophils Absolute: 147 cells/uL (ref 15–500)
Eosinophils Relative: 2.3 %
HCT: 42.6 % (ref 35.0–45.0)
Hemoglobin: 14.5 g/dL (ref 11.7–15.5)
Lymphs Abs: 2566 cells/uL (ref 850–3900)
MCV: 88.4 fL (ref 80.0–100.0)
MPV: 11.2 fL (ref 7.5–12.5)
Platelets: 237 10*3/uL (ref 140–400)
Total Lymphocyte: 40.1 %

## 2022-04-02 MED ORDER — BUSPIRONE HCL 5 MG PO TABS: ORAL_TABLET | ORAL | 0 refills | Status: DC

## 2022-04-02 NOTE — Progress Notes (Signed)
FOLLOW UP  Assessment and Plan:   Elevated blood pressure reading in office with diagnosis of hypertension Check BP in home. If consistently >130/90 RTC for further evaluation and adjustment to POC. Report to ER for any increase in stroke like symptoms, including HA, N/V, paralysis, difficulty speaking, trouble walking, confusion, vision changes, CP, heart palpitations, SOB, diaphoresis.  - CBC with Differential/Platelet - COMPLETE METABOLIC PANEL WITH GFR  Obesity (BMI 30-39.9) Discussed appropriate BMI Goal of losing 1 lb per month. Diet modification, suggested Mediterranean diet. Encouraged Physical activity. Encouraged/praised to build confidence.  - Lipid panel - TSH - Hemoglobin A1c   Vitamin D deficiency  - VITAMIN D 25 Hydroxy (Vit-D Deficiency, Fractures)  Vitamin B 12 deficiency  - Vitamin B12  Screening for thyroid disorder  - TSH  Anxiety Start Buspar  Continue to monitor   Continue diet and meds as discussed. Further disposition pending results of labs. Discussed med's effects and SE's.   Over 30 minutes of exam, counseling, chart review, and critical decision making was performed.   Future Appointments  Date Time Provider Department Center  05/08/2022 10:30 AM Adela Glimpse, NP [facility] None  04/03/2023 10:00 AM Adela Glimpse, NP [facility] None    ----------------------------------------------------------------------------------------------------------------------  HPI  66 y.o. female  presents for a follow up after not being seen in the last two years due to taking care of her mother.  Her mother has since past approximately 15 mo ago.  She is upset that she has gained weight. Her mood is stable, however, she remains anxious daily and at times does not want to go inside the home where she lives and took care of her mother.  She has trouble sleeping and takes Melatonin PRN but this givers her nightmares.  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   She tried to retire last year but her daughter works with her and wanted her to work for one more year.  She is here to follow up on hypertension, cholesterol, diabetes, weight and vitamin D deficiency.   She is UTD on her mammogram, due in one year. Reports no evidence of malignancy.  She is UTD on her Colonoscopy, due 2025.  She is UTD on DEXA.   BMI is Body mass index is 29.04 kg/m., she has not been working on diet and exercise. Wt Readings from Last 3 Encounters:  04/02/22 191 lb (86.6 kg)  07/15/14 156 lb (70.8 kg)  06/24/14 156 lb 9.6 oz (71 kg)    Her blood pressure has not been controlled at home, today their BP is BP: (!) 152/98  She does workout. She denies chest pain, shortness of breath, dizziness.   She is not on cholesterol medication . Her cholesterol is not at goal. The cholesterol last visit was:   Lab Results  Component Value Date   CHOL 218 (H) 04/11/2014   HDL 57 04/11/2014   LDLCALC 144 (H) 04/11/2014   TRIG 84 04/11/2014   CHOLHDL 3.8 04/11/2014    She has not been working on diet and exercise for prediabetes, and denies hyperglycemia and hypoglycemia . Last A1C in the office was:  Lab Results  Component Value Date   HGBA1C 5.5 04/11/2014   Patient is on Vitamin D supplement.   Lab Results  Component Value Date   VD25OH 31 04/11/2014        Current Medications:  Current Outpatient Medications on File Prior to Visit  Medication Sig   albuterol (PROVENTIL HFA;VENTOLIN HFA) 108 (90  BASE) MCG/ACT inhaler Inhale 2 puffs into the lungs every 6 (six) hours as needed for wheezing or shortness of breath.   alendronate (FOSAMAX) 70 MG tablet Take 1 tablet (70 mg total) by mouth every 7 (seven) days. Take with a full glass of water on an empty stomach. (Patient not taking: Reported on 04/02/2022)   MELATONIN ER PO Take by mouth at bedtime as needed. (Patient not taking: Reported on 04/02/2022)   No current facility-administered medications on file prior to  visit.     Allergies: No Known Allergies   Medical History:  Past Medical History:  Diagnosis Date   Asthma    child hood   Pneumonia 01/05/2014   Family history- Reviewed and unchanged Social history- Reviewed and unchanged   Review of Systems:  ROS    Physical Exam: BP (!) 152/98   Pulse 78   Temp (!) 96.8 F (36 C)   Wt 191 lb (86.6 kg)   SpO2 99%   BMI 29.04 kg/m  Wt Readings from Last 3 Encounters:  04/02/22 191 lb (86.6 kg)  07/15/14 156 lb (70.8 kg)  06/24/14 156 lb 9.6 oz (71 kg)   General Appearance: Well nourished, in no apparent distress. Eyes: PERRLA, EOMs, conjunctiva no swelling or erythema Sinuses: No Frontal/maxillary tenderness ENT/Mouth: Ext aud canals clear, TMs without erythema, bulging. No erythema, swelling, or exudate on post pharynx.  Tonsils not swollen or erythematous. Hearing normal.  Neck: Supple, thyroid normal.  Respiratory: Respiratory effort normal, BS equal bilaterally without rales, rhonchi, wheezing or stridor.  Cardio: RRR with no MRGs. Brisk peripheral pulses without edema.  Abdomen: Soft, + BS.  Non tender, no guarding, rebound, hernias, masses. Lymphatics: Non tender without lymphadenopathy.  Musculoskeletal: Full ROM, 5/5 strength, Normal gait Skin: Warm, dry without rashes, lesions, ecchymosis.  Neuro: Cranial nerves intact. No cerebellar symptoms.  Psych: Awake and oriented X 3, normal affect, Insight and Judgment appropriate.    Adela Glimpse, NP 1:43 PM [facility]

## 2022-04-02 NOTE — Patient Instructions (Signed)

## 2022-04-03 ENCOUNTER — Encounter: Payer: Self-pay | Admitting: Nurse Practitioner

## 2022-04-03 LAB — COMPLETE METABOLIC PANEL WITH GFR
AG Ratio: 1.5 (calc) (ref 1.0–2.5)
ALT: 10 U/L (ref 6–29)
AST: 16 U/L (ref 10–35)
Albumin: 4.4 g/dL (ref 3.6–5.1)
Alkaline phosphatase (APISO): 111 U/L (ref 37–153)
BUN: 13 mg/dL (ref 7–25)
CO2: 25 mmol/L (ref 20–32)
Calcium: 9.8 mg/dL (ref 8.6–10.4)
Chloride: 105 mmol/L (ref 98–110)
Creat: 0.86 mg/dL (ref 0.50–1.05)
Globulin: 3 g/dL (calc) (ref 1.9–3.7)
Glucose, Bld: 88 mg/dL (ref 65–99)
Potassium: 4.5 mmol/L (ref 3.5–5.3)
Sodium: 141 mmol/L (ref 135–146)
Total Bilirubin: 0.6 mg/dL (ref 0.2–1.2)
Total Protein: 7.4 g/dL (ref 6.1–8.1)
eGFR: 74 mL/min/{1.73_m2} (ref >=60)

## 2022-04-03 LAB — CBC WITH DIFFERENTIAL/PLATELET
Basophils Absolute: 83 cells/uL (ref 0–200)
Basophils Relative: 1.3 %
MCH: 30.1 pg (ref 27.0–33.0)
MCHC: 34 g/dL (ref 32.0–36.0)
Monocytes Relative: 4.4 %
Neutro Abs: 3322 cells/uL (ref 1500–7800)
Neutrophils Relative %: 51.9 %
RBC: 4.82 10*6/uL (ref 3.80–5.10)
RDW: 12 % (ref 11.0–15.0)
WBC: 6.4 10*3/uL (ref 3.8–10.8)

## 2022-04-03 LAB — VITAMIN D 25 HYDROXY (VIT D DEFICIENCY, FRACTURES): Vit D, 25-Hydroxy: 13 ng/mL — ABNORMAL LOW (ref 30–100)

## 2022-04-03 LAB — LIPID PANEL
Cholesterol: 252 mg/dL — ABNORMAL HIGH (ref <200)
HDL: 54 mg/dL (ref >=50)
LDL Cholesterol (Calc): 171 mg/dL (calc) — ABNORMAL HIGH
Non-HDL Cholesterol (Calc): 198 mg/dL (calc) — ABNORMAL HIGH (ref <130)
Total CHOL/HDL Ratio: 4.7 (calc) (ref <5.0)
Triglycerides: 133 mg/dL (ref <150)

## 2022-04-03 LAB — HEMOGLOBIN A1C
Hgb A1c MFr Bld: 5.3 % of total Hgb (ref <5.7)
Mean Plasma Glucose: 105 mg/dL
eAG (mmol/L): 5.8 mmol/L

## 2022-04-03 LAB — TSH: TSH: 3.07 mIU/L (ref 0.40–4.50)

## 2022-04-03 LAB — VITAMIN B12: Vitamin B-12: 349 pg/mL (ref 200–1100)

## 2022-04-04 ENCOUNTER — Other Ambulatory Visit: Payer: Self-pay | Admitting: Nurse Practitioner

## 2022-04-04 DIAGNOSIS — E669 Obesity, unspecified: Secondary | ICD-10-CM

## 2022-04-04 DIAGNOSIS — E782 Mixed hyperlipidemia: Secondary | ICD-10-CM

## 2022-04-04 DIAGNOSIS — I10 Essential (primary) hypertension: Secondary | ICD-10-CM

## 2022-04-04 MED ORDER — ATORVASTATIN CALCIUM 10 MG PO TABS: 10.0000 mg | ORAL_TABLET | Freq: Every day | ORAL | 11 refills | Status: AC

## 2022-04-04 MED ORDER — WEGOVY 0.25 MG/0.5ML ~~LOC~~ SOAJ: 0.2500 mg | SUBCUTANEOUS | 0 refills | Status: DC

## 2022-05-07 ENCOUNTER — Other Ambulatory Visit: Payer: Self-pay

## 2022-05-07 MED ORDER — BUSPIRONE HCL 5 MG PO TABS: ORAL_TABLET | ORAL | 2 refills | Status: DC

## 2022-05-08 ENCOUNTER — Ambulatory Visit (INDEPENDENT_AMBULATORY_CARE_PROVIDER_SITE_OTHER): Payer: Medicare Other | Admitting: Nurse Practitioner

## 2022-05-08 ENCOUNTER — Encounter: Payer: Self-pay | Admitting: Nurse Practitioner

## 2022-05-08 VITALS — BP 150/96 | HR 74 | Temp 97.7°F | Ht 68.0 in | Wt 189.0 lb

## 2022-05-08 DIAGNOSIS — E669 Obesity, unspecified: Secondary | ICD-10-CM | POA: Diagnosis not present

## 2022-05-08 DIAGNOSIS — Z79899 Other long term (current) drug therapy: Secondary | ICD-10-CM | POA: Diagnosis not present

## 2022-05-08 DIAGNOSIS — I10 Essential (primary) hypertension: Secondary | ICD-10-CM

## 2022-05-08 DIAGNOSIS — T753XXA Motion sickness, initial encounter: Secondary | ICD-10-CM

## 2022-05-08 MED ORDER — OLMESARTAN MEDOXOMIL 5 MG PO TABS: 5.0000 mg | ORAL_TABLET | Freq: Every day | ORAL | 0 refills | Status: DC

## 2022-05-08 MED ORDER — ONDANSETRON 4 MG PO TBDP: 4.0000 mg | ORAL_TABLET | Freq: Three times a day (TID) | ORAL | 0 refills | Status: AC | PRN

## 2022-05-08 NOTE — Patient Instructions (Signed)
Motion Sickness Motion sickness is an unpleasant, temporary feeling of dizziness and nausea that may occur when you are traveling in a moving vehicle. You may also have abdominal pain, sweating, and paleness. You may experience motion sickness while riding in a boat, car, airplane, or even an amusement park ride. What are the causes? This condition may be caused when part of the inner ear (semicircular canal) is stimulated too much. The two canals help you keep your balance by sending signals to your brain about movement. You can get motion sickness if: The vestibular system is stimulated too much from the motion of your body. Your brain gets conflicting signals from the various motion sensors in your body. The effects of motion sickness may be increased by stress, dehydration, other illnesses, or drinking too much alcohol. What increases the risk? This condition is more likely to develop in: Children who are 2-12 years old. Women, especially those who are pregnant or taking birth control pills. People who get migraine headaches. People who have inner ear disorders. People who take certain medicines. What are the signs or symptoms? Symptoms of this condition include: Nausea and vomiting. Dizziness. Paleness. Sweating. Difficulty concentrating. Abdominal pain. Being unsteady when walking. The symptoms of motion sickness usually get better after the motion or traveling stops, but symptoms may last for hours or days. How is this diagnosed? This condition is diagnosed based on a physical exam, your medical history, and your description of the symptoms that you have while traveling or moving. How is this treated? For most people, symptoms fade quickly after the motion stops. Avoiding certain things or using certain techniques before or during travel can help prevent episodes of motion sickness. Your health care provider may recommend or prescribe medicine to help prevent motion sickness.  Medicine may be in the form of a patch that is placed behind your ear. Follow these instructions at home: Medicines Take or use over-the-counter and prescription medicines only as told by your health care provider. If you use a motion sickness patch, wash your hands right after you put the patch on. Touching your hands to your eyes after using the patch can enlarge (dilate) your pupils for 1-2 days and disturb your vision. Eating and drinking  Drink enough fluid to keep your urine pale yellow. Take small, frequent sips of liquids as needed while experiencing motion sickness. Staying hydrated may help relieve or prevent symptoms. Do not eat large meals before or during travel. When you travel long distances, eat small, bland meals. Do not drink alcohol before or during travel. When riding in a moving vehicle: Sit in an area of the vehicle where the least motion occurs. On an airplane, sit near the wing. Lie back in your seat if possible. On a boat, sit near the middle. In a car, sit in the front seat, not the back seat. Breathe slowly and deeply. Do not read or focus on nearby objects such as your mobile phone. Try watching the horizon or a distant object. This is especially helpful when you travel in a boat. In a car, ride in the front seat and look out the front window. Get some fresh air if possible. For example, open a window when you are riding in a car. General instructions If possible, avoid situations that cause your motion sickness. Do not smoke before or during travel. Avoid areas where people are smoking. Plan ahead for travel. Talk with your health care provider about whether you should use medicines to help   prevent motion sickness. Where to find more information Centers for Disease Control and Prevention: wwwnc.cdc.gov Contact a health care provider if: You faint. Your vomiting or nausea does not go away within 24 hours. You have persistent severe dizziness or light-headedness  when you stand up. You have a fever. Get help right away if: You have trouble breathing. You have severe pain in your abdomen or chest. You have a severe headache. You have trouble speaking. You develop weakness or numbness on one side of your body. These symptoms may represent a serious problem that is an emergency. Do not wait to see if the symptoms will go away. Get medical help right away. Call your local emergency services (911 in the [place] Do not drive yourself to the hospital. Summary Motion sickness is an unpleasant, temporary feeling of dizziness and nausea that occurs when traveling in a moving vehicle. You may also have abdominal pain, sweating, and paleness. The symptoms of motion sickness usually get better after the motion or traveling stops, but symptoms may last for hours or days. Plan ahead for travel. Talk with your health care provider about whether you should use medicines to help prevent motion sickness. This information is not intended to replace advice given to you by your health care provider. Make sure you discuss any questions you have with your health care provider. Document Revised: 09/27/2020 Document Reviewed: 09/05/2020 Elsevier Patient Education  2023 [employer]vier Inc[employer]  

## 2022-05-08 NOTE — Progress Notes (Signed)
FOLLOW UP  Assessment and Plan:   Holly Mclean was seen today for a follow up visit.   Diagnoses and all order for this visit:  Hypertension Start Olmesartan 5 mg daily Continue to monitor BP. Goal <130/90 Continue DASH diet.   Reminder to go to the ER if any CP, SOB, nausea, dizziness, severe HA, changes vision/speech, left arm numbness and tingling and jaw pain.   Obesity with co morbidities Discussed appropriate BMI Goal of losing 1 lb per month. Diet modification. Physical activity. Encouraged/praised to build confidence. Discussed starting Phentermine if unsuccessful pending control of BP. Continue to monitor  Sea Sickness Start Ondansetron 4 mg  Stay well hydrated Symptoms of motion sickness usually get better after motion or traveling stops   Medication Management All medications discussed and reviewed in full. All questions and concerns regarding medications addressed.    Meds ordered this encounter  Medications   olmesartan (BENICAR) 5 MG tablet    Sig: Take 1 tablet (5 mg total) by mouth daily.    Dispense:  30 tablet    Refill:  0    Order Specific Question:   Supervising Provider    Answer:   Lucky Cowboy [[id number]]   ondansetron (ZOFRAN-ODT) 4 MG disintegrating tablet    Sig: Take 1 tablet (4 mg total) by mouth every 8 (eight) hours as needed for nausea or vomiting.    Dispense:  20 tablet    Refill:  0    Order Specific Question:   Supervising Provider    Answer:   Lucky Cowboy [phone]    Continue diet and meds as discussed. Further disposition pending results of labs. Discussed med's effects and SE's.    Over 20 minutes of exam, counseling, chart review, and critical decision making was performed.   Future Appointments  Date Time Provider Department Center  06/10/2022 11:00 AM Adela Glimpse, NP [facility] None  04/03/2023 10:00 AM Adela Glimpse, NP [facility] None     ----------------------------------------------------------------------------------------------------------------------  HPI 66 y.o. female  presents for 1 month follow up on HTN and obesity.    Today she states that she will be traveling on a cruise to the [place] next week.  She endorses sea sickness and is requesting management while she is away.  She reports taking OTC dramamine in the past but is unable to tolerate d/t SE and making her too drowsy to enjoy the trip.   BMI is Body mass index is 28.74 kg/m., she has been working on diet and exercise. She has lost 2 lb in the last month. Wt Readings from Last 3 Encounters:  05/08/22 189 lb (85.7 kg)  04/02/22 191 lb (86.6 kg)  07/15/14 156 lb (70.8 kg)    Her blood pressure has not been controlled at home with reports of average systolically around 155-160, today their BP is BP: (!) 150/96  She does workout. She denies chest pain, shortness of breath, dizziness.  She denies HA, blurry vision.   Current Medications:  Current Outpatient Medications on File Prior to Visit  Medication Sig   atorvastatin (LIPITOR) 10 MG tablet Take 1 tablet (10 mg total) by mouth daily.   busPIRone (BUSPAR) 5 MG tablet Take 1 tab (5 mg) HS.   MELATONIN ER PO Take by mouth at bedtime as needed.   albuterol (PROVENTIL HFA;VENTOLIN HFA) 108 (90 BASE) MCG/ACT inhaler Inhale 2 puffs into the lungs every 6 (six) hours as needed for wheezing or shortness of breath. (Patient not taking: Reported on 05/08/2022)  alendronate (FOSAMAX) 70 MG tablet Take 1 tablet (70 mg total) by mouth every 7 (seven) days. Take with a full glass of water on an empty stomach. (Patient not taking: Reported on 04/02/2022)   Semaglutide-Weight Management (WEGOVY) 0.25 MG/0.5ML SOAJ Inject 0.25 mg into the skin once a week.   No current facility-administered medications on file prior to visit.     Allergies: No Known Allergies   Medical History:  Past Medical History:   Diagnosis Date   Asthma    child hood   Pneumonia 01/05/2014   Family history- Reviewed and unchanged Social history- Reviewed and unchanged   Review of Systems: All review systems reviewed and negative except for pertinent positives in history of present illness.   Physical Exam: BP (!) 150/96   Pulse 74   Temp 97.7 F (36.5 C)   Ht 5\' 8"  (1.727 m)   Wt 189 lb (85.7 kg)   SpO2 97%   BMI 28.74 kg/m    General Appearance: Well nourished, in no apparent distress. Eyes: PERRLA, EOMs, conjunctiva no swelling or erythema Sinuses: No Frontal/maxillary tenderness ENT/Mouth: Ext aud canals clear, TMs without erythema, bulging. No erythema, swelling, or exudate on post pharynx.  Tonsils not swollen or erythematous. Hearing normal.  Neck: Supple, thyroid normal.  Respiratory: Respiratory effort normal, BS equal bilaterally without rales, rhonchi, wheezing or stridor.  Cardio: RRR with no MRGs. Brisk peripheral pulses without edema.  Abdomen: Soft, + BS.  Non tender, no guarding, rebound, hernias, masses. Lymphatics: Non tender without lymphadenopathy.  Musculoskeletal: Full ROM, 5/5 strength, Normal gait Skin: Warm, dry without rashes, lesions, ecchymosis.  Neuro: Cranial nerves intact. No cerebellar symptoms.  Psych: Awake and oriented X 3, normal affect, Insight and Judgment appropriate.    , NP 1:24 PM [facility]

## 2022-05-30 ENCOUNTER — Other Ambulatory Visit: Payer: Self-pay | Admitting: Nurse Practitioner

## 2022-06-10 ENCOUNTER — Encounter: Payer: Self-pay | Admitting: Nurse Practitioner

## 2022-06-10 ENCOUNTER — Ambulatory Visit (INDEPENDENT_AMBULATORY_CARE_PROVIDER_SITE_OTHER): Payer: Medicare Other | Admitting: Nurse Practitioner

## 2022-06-10 VITALS — BP 150/90 | HR 80 | Temp 97.9°F | Resp 16 | Ht 68.0 in | Wt 188.4 lb

## 2022-06-10 DIAGNOSIS — E669 Obesity, unspecified: Secondary | ICD-10-CM | POA: Diagnosis not present

## 2022-06-10 DIAGNOSIS — F419 Anxiety disorder, unspecified: Secondary | ICD-10-CM

## 2022-06-10 DIAGNOSIS — E782 Mixed hyperlipidemia: Secondary | ICD-10-CM | POA: Diagnosis not present

## 2022-06-10 DIAGNOSIS — I10 Essential (primary) hypertension: Secondary | ICD-10-CM | POA: Diagnosis not present

## 2022-06-10 DIAGNOSIS — Z79899 Other long term (current) drug therapy: Secondary | ICD-10-CM | POA: Diagnosis not present

## 2022-06-10 NOTE — Patient Instructions (Signed)

## 2022-06-10 NOTE — Progress Notes (Signed)
FOLLOW UP  Assessment and Plan:   Holly Mclean was seen today for a follow up visit.   Diagnoses and all order for this visit:  Hypertension Controlled at home Start Olmesartan 5 mg daily Continue to monitor BP. Goal <130/90 Continue DASH diet.   Reminder to go to the ER if any CP, SOB, nausea, dizziness, severe HA, changes vision/speech, left arm numbness and tingling and jaw pain.   Obesity with co morbidities Discussed appropriate BMI Goal of losing 1 lb per month. Diet modification. Physical activity. Encouraged/praised to build confidence. Discussed starting Phentermine if unsuccessful pending control of BP. Continue to monitor   Medication Management All medications discussed and reviewed in full. All questions and concerns regarding medications addressed.    Mixed hyperlipidemia Last blood work 03/2022 revealed elevated cholesterol. Continue Atorvastatin. Discussed lifestyle modifications. Recommended diet heavy in fruits and veggies, omega 3's. Decrease consumption of animal meats, cheeses, and dairy products. Remain active and exercise as tolerated. Continue to monitor. Check/monitor lipids/TSH - Due next OV   No orders of the defined types were placed in this encounter.   Continue diet and meds as discussed. Further disposition pending results of labs. Discussed med's effects and SE's.    Over 20 minutes of exam, counseling, chart review, and critical decision making was performed.   Future Appointments  Date Time Provider Department Center  04/03/2023 10:00 AM Adela Glimpse, NP [facility] None    ----------------------------------------------------------------------------------------------------------------------  HPI 66 y.o. female  presents for 1 month follow up on HTN and obesity.   She has just returned from a trip to the [place].  Felt as though she relieved some stress.  She was stared on Olmesartan last OV for tmt of HTN.  She has been  compliant with medication.  BP is above goal in office today however, well controlled in the home. She has been checking in home with average 130-140/65-75.    She tries to workout and has started to monitor portion control. She denies chest pain, shortness of breath, dizziness.  She denies HA, blurry vision.  She continues to take Buspar for treatment of anxiety.  Currently well controlled.   BMI is Body mass index is 28.65 kg/m., she has been working on diet and exercise. She has lost 1 lb in the last month.  She has set a goal to lose 10 lb in the next 3 mo. Wt Readings from Last 3 Encounters:  06/10/22 188 lb 6.4 oz (85.5 kg)  05/08/22 189 lb (85.7 kg)  04/02/22 191 lb (86.6 kg)    Current Medications:  Current Outpatient Medications on File Prior to Visit  Medication Sig   albuterol (PROVENTIL HFA;VENTOLIN HFA) 108 (90 BASE) MCG/ACT inhaler Inhale 2 puffs into the lungs every 6 (six) hours as needed for wheezing or shortness of breath.   alendronate (FOSAMAX) 70 MG tablet Take 1 tablet (70 mg total) by mouth every 7 (seven) days. Take with a full glass of water on an empty stomach.   atorvastatin (LIPITOR) 10 MG tablet Take 1 tablet (10 mg total) by mouth daily.   busPIRone (BUSPAR) 5 MG tablet Take 1 tab (5 mg) HS.   MELATONIN ER PO Take by mouth at bedtime as needed.   olmesartan (BENICAR) 5 MG tablet TAKE 1 TABLET (5 MG TOTAL) BY MOUTH DAILY.   ondansetron (ZOFRAN-ODT) 4 MG disintegrating tablet Take 1 tablet (4 mg total) by mouth every 8 (eight) hours as needed for nausea or vomiting.   Semaglutide-Weight Management (  WEGOVY) 0.25 MG/0.5ML SOAJ Inject 0.25 mg into the skin once a week. (Patient not taking: Reported on 06/10/2022)   No current facility-administered medications on file prior to visit.     Allergies: No Known Allergies   Medical History:  Past Medical History:  Diagnosis Date   Asthma    child hood   Pneumonia 01/05/2014   Family history- Reviewed and  unchanged Social history- Reviewed and unchanged   Review of Systems: All review systems reviewed and negative except for pertinent positives in history of present illness.   Physical Exam: BP (!) 150/90   Pulse 80   Temp 97.9 F (36.6 C)   Resp 16   Ht 5\' 8"  (1.727 m)   Wt 188 lb 6.4 oz (85.5 kg)   SpO2 99%   BMI 28.65 kg/m    General Appearance: Well nourished, in no apparent distress. Eyes: PERRLA, EOMs, conjunctiva no swelling or erythema Sinuses: No Frontal/maxillary tenderness ENT/Mouth: Ext aud canals clear, TMs without erythema, bulging. No erythema, swelling, or exudate on post pharynx.  Tonsils not swollen or erythematous. Hearing normal.  Neck: Supple, thyroid normal.  Respiratory: Respiratory effort normal, BS equal bilaterally without rales, rhonchi, wheezing or stridor.  Cardio: RRR with no MRGs. Brisk peripheral pulses without edema.  Abdomen: Soft, + BS.  Non tender, no guarding, rebound, hernias, masses. Lymphatics: Non tender without lymphadenopathy.  Musculoskeletal: Full ROM, 5/5 strength, Normal gait Skin: Warm, dry without rashes, lesions, ecchymosis.  Neuro: Cranial nerves intact. No cerebellar symptoms.  Psych: Awake and oriented X 3, normal affect, Insight and Judgment appropriate.    , NP 11:41 AM [facility]

## 2022-06-27 ENCOUNTER — Other Ambulatory Visit: Payer: Self-pay | Admitting: Nurse Practitioner

## 2022-07-25 ENCOUNTER — Other Ambulatory Visit: Payer: Self-pay | Admitting: Nurse Practitioner

## 2022-08-22 ENCOUNTER — Other Ambulatory Visit: Payer: Self-pay | Admitting: Nurse Practitioner

## 2022-09-17 ENCOUNTER — Other Ambulatory Visit: Payer: Self-pay | Admitting: Nurse Practitioner

## 2022-09-23 ENCOUNTER — Ambulatory Visit: Payer: Medicare Other | Admitting: Nurse Practitioner

## 2022-09-25 ENCOUNTER — Other Ambulatory Visit: Payer: Self-pay

## 2022-09-25 ENCOUNTER — Ambulatory Visit (INDEPENDENT_AMBULATORY_CARE_PROVIDER_SITE_OTHER): Payer: Medicare Other | Admitting: Nurse Practitioner

## 2022-09-25 ENCOUNTER — Encounter: Payer: Self-pay | Admitting: Nurse Practitioner

## 2022-09-25 VITALS — HR 101 | Temp 97.2°F | Ht 68.0 in

## 2022-09-25 DIAGNOSIS — R6889 Other general symptoms and signs: Secondary | ICD-10-CM | POA: Diagnosis not present

## 2022-09-25 DIAGNOSIS — Z1152 Encounter for screening for COVID-19: Secondary | ICD-10-CM | POA: Diagnosis not present

## 2022-09-25 DIAGNOSIS — J111 Influenza due to unidentified influenza virus with other respiratory manifestations: Secondary | ICD-10-CM

## 2022-09-25 LAB — POCT INFLUENZA A/B
Influenza A, POC: POSITIVE — AB
Influenza B, POC: NEGATIVE

## 2022-09-25 LAB — POC COVID19 BINAXNOW: SARS Coronavirus 2 Ag: NEGATIVE

## 2022-09-25 MED ORDER — PROMETHAZINE-DM 6.25-15 MG/5ML PO SYRP: 5.0000 mL | ORAL_SOLUTION | Freq: Four times a day (QID) | ORAL | 1 refills | Status: DC | PRN

## 2022-09-25 MED ORDER — DEXAMETHASONE 1 MG PO TABS: ORAL_TABLET | ORAL | 0 refills | Status: DC

## 2022-09-25 MED ORDER — ALBUTEROL SULFATE HFA 108 (90 BASE) MCG/ACT IN AERS: 2.0000 | INHALATION_SPRAY | Freq: Four times a day (QID) | RESPIRATORY_TRACT | 2 refills | Status: AC | PRN

## 2022-09-25 NOTE — Patient Instructions (Signed)

## 2022-09-25 NOTE — Progress Notes (Signed)
THIS ENCOUNTER IS A VIRTUAL VISIT DUE TO Flu A - PATIENT WAS NOT SEEN IN THE OFFICE.  PATIENT HAS CONSENTED TO VIRTUAL VISIT / TELEMEDICINE VISIT   Virtual Visit via telephone Note  I connected with  Holly Mclean on 09/25/2022 by telephone.  I verified that I am speaking with the correct person using two identifiers.    I discussed the limitations of evaluation and management by telemedicine and the availability of in person appointments. The patient expressed understanding and agreed to proceed.  History of Present Illness:  Pulse (!) 101   Temp (!) 97.2 F (36.2 C)   Ht 5\' 8"  (1.727 m)   SpO2 96%   BMI 28.65 kg/m  66 y.o. patient contacted office reporting URI sx . she tested positive for Flu A in office parking lot today. OV was conducted by telephone to minimize exposure.      Sx began yesterday days ago with fever, diarrhea, joint pain, runny nose, sore throat, body aches, productive cough of yellow mucus, headache and weakness.  Treatments tried so far: Motrin  Exposures: Granddaughter   Medications  Current Outpatient Medications (Endocrine & Metabolic):    dexamethasone (DECADRON) 1 MG tablet, Take 3 tabs for 3 days, 2 tabs for 3 days 1 tab for 5 days. Take with food.   alendronate (FOSAMAX) 70 MG tablet, Take 1 tablet (70 mg total) by mouth every 7 (seven) days. Take with a full glass of water on an empty stomach. (Patient not taking: Reported on 09/25/2022)  Current Outpatient Medications (Cardiovascular):    atorvastatin (LIPITOR) 10 MG tablet, Take 1 tablet (10 mg total) by mouth daily.   olmesartan (BENICAR) 5 MG tablet, TAKE 1 TABLET (5 MG TOTAL) BY MOUTH DAILY.  Current Outpatient Medications (Respiratory):    promethazine-dextromethorphan (PROMETHAZINE-DM) 6.25-15 MG/5ML syrup, Take 5 mLs by mouth 4 (four) times daily as needed for cough.   albuterol (VENTOLIN HFA) 108 (90 Base) MCG/ACT inhaler, Inhale 2 puffs into the lungs every 6 (six) hours as needed  for wheezing or shortness of breath.  Current Outpatient Medications (Analgesics):    ibuprofen (ADVIL) 200 MG tablet, Take 200 mg by mouth every 6 (six) hours as needed.   Current Outpatient Medications (Other):    busPIRone (BUSPAR) 5 MG tablet, Take 1 tab (5 mg) HS.   ondansetron (ZOFRAN-ODT) 4 MG disintegrating tablet, Take 1 tablet (4 mg total) by mouth every 8 (eight) hours as needed for nausea or vomiting.   MELATONIN ER PO, Take by mouth at bedtime as needed. (Patient not taking: Reported on 09/25/2022)   Semaglutide-Weight Management (WEGOVY) 0.25 MG/0.5ML SOAJ, Inject 0.25 mg into the skin once a week. (Patient not taking: Reported on 06/10/2022)  Allergies: No Known Allergies  Problem list She has Pneumonia on their problem list.   Social History:   reports that she has never smoked. She has never used smokeless tobacco. She reports that she does not drink alcohol and does not use drugs.  Observations/Objective:  General : Well sounding patient in no apparent distress HEENT: no hoarseness, no cough for duration of visit Lungs: speaks in complete sentences, no audible wheezing, no apparent distress Neurological: alert, oriented x 3 Psychiatric: pleasant, judgement appropriate   Assessment and Plan: Diagnoses and all orders for this visit:  Flu Push fluids, Mucinex and Advil/tylenol as needed for aches/fever Continue vitamin D, Vitamin C and Zinc for immune support If symptoms worsen or are not improved by Monday notify the office -  POCT Influenza A/B -     albuterol (VENTOLIN HFA) 108 (90 Base) MCG/ACT inhaler; Inhale 2 puffs into the lungs every 6 (six) hours as needed for wheezing or shortness of breath. -     dexamethasone (DECADRON) 1 MG tablet; Take 3 tabs for 3 days, 2 tabs for 3 days 1 tab for 5 days. Take with food. -     promethazine-dextromethorphan (PROMETHAZINE-DM) 6.25-15 MG/5ML syrup; Take 5 mLs by mouth 4 (four) times daily as needed for  cough.  Encounter for screening for COVID-19 -     POC COVID-19        Follow Up Instructions:  I discussed the assessment and treatment plan with the patient. The patient was provided an opportunity to ask questions and all were answered. The patient agreed with the plan and demonstrated an understanding of the instructions.   The patient was advised to call back or seek an in-person evaluation if the symptoms worsen or if the condition fails to improve as anticipated.  I provided 20 minutes of non-face-to-face time during this encounter.   Raynelle Dick, NP

## 2022-10-15 ENCOUNTER — Ambulatory Visit (INDEPENDENT_AMBULATORY_CARE_PROVIDER_SITE_OTHER): Payer: Medicare Other | Admitting: Nurse Practitioner

## 2022-10-15 ENCOUNTER — Encounter: Payer: Self-pay | Admitting: Nurse Practitioner

## 2022-10-15 ENCOUNTER — Other Ambulatory Visit: Payer: Self-pay | Admitting: Internal Medicine

## 2022-10-15 VITALS — BP 150/90 | HR 81 | Temp 97.0°F | Ht 68.0 in | Wt 190.0 lb

## 2022-10-15 DIAGNOSIS — E782 Mixed hyperlipidemia: Secondary | ICD-10-CM | POA: Diagnosis not present

## 2022-10-15 DIAGNOSIS — Z79899 Other long term (current) drug therapy: Secondary | ICD-10-CM | POA: Diagnosis not present

## 2022-10-15 DIAGNOSIS — E669 Obesity, unspecified: Secondary | ICD-10-CM | POA: Diagnosis not present

## 2022-10-15 DIAGNOSIS — J111 Influenza due to unidentified influenza virus with other respiratory manifestations: Secondary | ICD-10-CM

## 2022-10-15 DIAGNOSIS — I10 Essential (primary) hypertension: Secondary | ICD-10-CM

## 2022-10-15 DIAGNOSIS — E559 Vitamin D deficiency, unspecified: Secondary | ICD-10-CM | POA: Diagnosis not present

## 2022-10-15 DIAGNOSIS — Z1231 Encounter for screening mammogram for malignant neoplasm of breast: Secondary | ICD-10-CM

## 2022-10-15 DIAGNOSIS — F419 Anxiety disorder, unspecified: Secondary | ICD-10-CM

## 2022-10-15 NOTE — Patient Instructions (Signed)
Vitamin D 5,000 IU Daily  Healthy Eating Following a healthy eating pattern may help you to achieve and maintain a healthy body weight, reduce the risk of chronic disease, and live a long and productive life. It is important to follow a healthy eating pattern at an appropriate calorie level for your body. Your nutritional needs should be met primarily through food by choosing a variety of nutrient-rich foods. What are tips for following this plan? Reading food labels Read labels and choose the following: Reduced or low sodium. Juices with 100% fruit juice. Foods with low saturated fats and high polyunsaturated and monounsaturated fats. Foods with whole grains, such as whole wheat, cracked wheat, brown rice, and wild rice. Whole grains that are fortified with folic acid. This is recommended for women who are pregnant or who want to become pregnant. Read labels and avoid the following: Foods with a lot of added sugars. These include foods that contain brown sugar, corn sweetener, corn syrup, dextrose, fructose, glucose, high-fructose corn syrup, honey, invert sugar, lactose, malt syrup, maltose, molasses, raw sugar, sucrose, trehalose, or turbinado sugar. Do not eat more than the following amounts of added sugar per day: 6 teaspoons (25 g) for women. 9 teaspoons (38 g) for men. Foods that contain processed or refined starches and grains. Refined grain products, such as white flour, degermed cornmeal, white bread, and white rice. Shopping Choose nutrient-rich snacks, such as vegetables, whole fruits, and nuts. Avoid high-calorie and high-sugar snacks, such as potato chips, fruit snacks, and candy. Use oil-based dressings and spreads on foods instead of solid fats such as butter, stick margarine, or cream cheese. Limit pre-made sauces, mixes, and "instant" products such as flavored rice, instant noodles, and ready-made pasta. Try more plant-protein sources, such as tofu, tempeh, black beans,  edamame, lentils, nuts, and seeds. Explore eating plans such as the Mediterranean diet or vegetarian diet. Cooking Use oil to saut or stir-fry foods instead of solid fats such as butter, stick margarine, or lard. Try baking, boiling, grilling, or broiling instead of frying. Remove the fatty part of meats before cooking. Steam vegetables in water or broth. Meal planning  At meals, imagine dividing your plate into fourths: One-half of your plate is fruits and vegetables. One-fourth of your plate is whole grains. One-fourth of your plate is protein, especially lean meats, poultry, eggs, tofu, beans, or nuts. Include low-fat dairy as part of your daily diet. Lifestyle Choose healthy options in all settings, including home, work, school, restaurants, or stores. Prepare your food safely: Wash your hands after handling raw meats. Keep food preparation surfaces clean by regularly washing with hot, soapy water. Keep raw meats separate from ready-to-eat foods, such as fruits and vegetables. Cook seafood, meat, poultry, and eggs to the recommended internal temperature. Store foods at safe temperatures. In general: Keep cold foods at 25F (4.4C) or below. Keep hot foods at 125F (60C) or above. Keep your freezer at [facility] (-17.8C) or below. Foods are no longer safe to eat when they have been between the temperatures of 40-125F (4.4-60C) for more than 2 hours. What foods should I eat? Fruits Aim to eat 2 cup-equivalents of fresh, canned (in natural juice), or frozen fruits each day. Examples of 1 cup-equivalent of fruit include 1 small apple, 8 large strawberries, 1 cup canned fruit,  cup dried fruit, or 1 cup 100% juice. Vegetables Aim to eat 2-3 cup-equivalents of fresh and frozen vegetables each day, including different varieties and colors. Examples of 1 cup-equivalent of vegetables include  2 medium carrots, 2 cups raw, leafy greens, 1 cup chopped vegetable (raw or cooked), or 1 medium  baked potato. Grains Aim to eat 6 ounce-equivalents of whole grains each day. Examples of 1 ounce-equivalent of grains include 1 slice of bread, 1 cup ready-to-eat cereal, 3 cups popcorn, or  cup cooked rice, pasta, or cereal. Meats and other proteins Aim to eat 5-6 ounce-equivalents of protein each day. Examples of 1 ounce-equivalent of protein include 1 egg, 1/2 cup nuts or seeds, or 1 tablespoon (16 g) peanut butter. A cut of meat or fish that is the size of a deck of cards is about 3-4 ounce-equivalents. Of the protein you eat each week, try to have at least 8 ounces come from seafood. This includes salmon, trout, herring, and anchovies. Dairy Aim to eat 3 cup-equivalents of fat-free or low-fat dairy each day. Examples of 1 cup-equivalent of dairy include 1 cup (240 mL) milk, 8 ounces (250 g) yogurt, 1 ounces (44 g) natural cheese, or 1 cup (240 mL) fortified soy milk. Fats and oils Aim for about 5 teaspoons (21 g) per day. Choose monounsaturated fats, such as canola and olive oils, avocados, peanut butter, and most nuts, or polyunsaturated fats, such as sunflower, corn, and soybean oils, walnuts, pine nuts, sesame seeds, sunflower seeds, and flaxseed. Beverages Aim for six 8-oz glasses of water per day. Limit coffee to three to five 8-oz cups per day. Limit caffeinated beverages that have added calories, such as soda and energy drinks. Limit alcohol intake to no more than 1 drink a day for nonpregnant women and 2 drinks a day for men. One drink equals 12 oz of beer (355 mL), 5 oz of wine (148 mL), or 1 oz of hard liquor (44 mL). Seasoning and other foods Avoid adding excess amounts of salt to your foods. Try flavoring foods with herbs and spices instead of salt. Avoid adding sugar to foods. Try using oil-based dressings, sauces, and spreads instead of solid fats. This information is based on general [place] nutrition guidelines. For more information, visit [employer]. Exact amounts may  vary based on your nutrition needs. Summary A healthy eating plan may help you to maintain a healthy weight, reduce the risk of chronic diseases, and stay active throughout your life. Plan your meals. Make sure you eat the right portions of a variety of nutrient-rich foods. Try baking, boiling, grilling, or broiling instead of frying. Choose healthy options in all settings, including home, work, school, restaurants, or stores. This information is not intended to replace advice given to you by your health care provider. Make sure you discuss any questions you have with your health care provider. Document Revised: 03/13/2022 Document Reviewed: 05/29/2021 Elsevier Patient Education  [place].

## 2022-10-15 NOTE — Progress Notes (Signed)
FOLLOW UP  Assessment and Plan:   Holly Mclean was seen today for a follow up visit.   Diagnoses and all order for this visit:  Hypertension Controlled at home Start Olmesartan 5 mg daily Continue to monitor BP. Goal <130/90 Continue DASH diet.   Reminder to go to the ER if any CP, SOB, nausea, dizziness, severe HA, changes vision/speech, left arm numbness and tingling and jaw pain.   Obesity with co morbidities Discussed appropriate BMI Goal of losing 1 lb per month. Diet modification. Physical activity. Encouraged/praised to build confidence. Discussed starting Phentermine if unsuccessful pending control of BP. Continue to monitor   Medication Management All medications discussed and reviewed in full. All questions and concerns regarding medications addressed.    Mixed hyperlipidemia Last blood work 03/2022 revealed elevated cholesterol. Continue Atorvastatin. Discussed lifestyle modifications. Recommended diet heavy in fruits and veggies, omega 3's. Decrease consumption of animal meats, cheeses, and dairy products. Remain active and exercise as tolerated. Continue to monitor. Check/monitor lipids/TSH - Due next OV  Anxiety Continue Buspar.   Reviewed relaxation techniques.  Sleep hygiene. Recommended Cognitive Behavioral Therapy (CBT). Recommended mindfulness meditation and exercise.   Insight-oriented psychotherapy given for 16 minutes exclusively. Psychoeducation:  encouraged personality growth wand development through coping techniques and problem-solving skills. Limit/Decrease/Monitor drug/alcohol intake.     Influenza A Resolved  Vitamin D Deficiency Below goal 03/2022 Continue supplement at least 5,000 IU daily. Monitor levels  Orders Placed This Encounter  Procedures   CBC with Differential/Platelet   COMPLETE METABOLIC PANEL WITH GFR   Lipid panel   VITAMIN D 25 Hydroxy (Vit-D Deficiency, Fractures)     Notify office for further evaluation  and treatment, questions or concerns if any reported s/s fail to improve.   The patient was advised to call back or seek an in-person evaluation if any symptoms worsen or if the condition fails to improve as anticipated.   Further disposition pending results of labs. Discussed med's effects and SE's.    I discussed the assessment and treatment plan with the patient. The patient was provided an opportunity to ask questions and all were answered. The patient agreed with the plan and demonstrated an understanding of the instructions.  Discussed med's effects and SE's. Screening labs and tests as requested with regular follow-up as recommended.  I provided 20 minutes of face-to-face time during this encounter including counseling, chart review, and critical decision making was preformed.   Future Appointments  Date Time Provider [place]  04/03/2023 10:00 AM Darrol Jump, NP [facility] None    ----------------------------------------------------------------------------------------------------------------------  HPI 67 y.o. female  presents for 1 month follow up on HTN and obesity.   She is recovering from the flu.  Diagnosed 09/25/22.  She does not have any lingering symptoms.  However, BP is elevated in clinic.  She reports that BP has been well controlled at home.  She continues Olmesartan as directed.  She denies chest pain, shortness of breath, dizziness.  She denies HA, blurry vision.  She continues to take Buspar for treatment of anxiety.  Currently well controlled.  BMI is Body mass index is 28.89 kg/m., she has been working on diet and exercise.  Wt Readings from Last 3 Encounters:  10/15/22 190 lb (86.2 kg)  06/10/22 188 lb 6.4 oz (85.5 kg)  05/08/22 189 lb (85.7 kg)    Current Medications:  Current Outpatient Medications on File Prior to Visit  Medication Sig   albuterol (VENTOLIN HFA) 108 (90 Base) MCG/ACT inhaler Inhale  2 puffs into the lungs every 6 (six)  hours as needed for wheezing or shortness of breath.   atorvastatin (LIPITOR) 10 MG tablet Take 1 tablet (10 mg total) by mouth daily.   busPIRone (BUSPAR) 5 MG tablet Take 1 tab (5 mg) HS.   ibuprofen (ADVIL) 200 MG tablet Take 200 mg by mouth every 6 (six) hours as needed.   MELATONIN ER PO Take by mouth at bedtime as needed.   olmesartan (BENICAR) 5 MG tablet TAKE 1 TABLET (5 MG TOTAL) BY MOUTH DAILY.   ondansetron (ZOFRAN-ODT) 4 MG disintegrating tablet Take 1 tablet (4 mg total) by mouth every 8 (eight) hours as needed for nausea or vomiting.   alendronate (FOSAMAX) 70 MG tablet Take 1 tablet (70 mg total) by mouth every 7 (seven) days. Take with a full glass of water on an empty stomach. (Patient not taking: Reported on 09/25/2022)   dexamethasone (DECADRON) 1 MG tablet Take 3 tabs for 3 days, 2 tabs for 3 days 1 tab for 5 days. Take with food.   promethazine-dextromethorphan (PROMETHAZINE-DM) 6.25-15 MG/5ML syrup Take 5 mLs by mouth 4 (four) times daily as needed for cough.   Semaglutide-Weight Management (WEGOVY) 0.25 MG/0.5ML SOAJ Inject 0.25 mg into the skin once a week. (Patient not taking: Reported on 06/10/2022)   No current facility-administered medications on file prior to visit.     Allergies: No Known Allergies   Medical History:  Past Medical History:  Diagnosis Date   Asthma    child hood   Pneumonia 01/05/2014   Family history- Reviewed and unchanged Social history- Reviewed and unchanged   Review of Systems: All review systems reviewed and negative except for pertinent positives in history of present illness.   Physical Exam: BP (!) 150/100   Pulse 81   Temp (!) 97 F (36.1 C)   Ht 5\' 8"  (1.727 m)   Wt 190 lb (86.2 kg)   SpO2 95%   BMI 28.89 kg/m    General Appearance: Well nourished, in no apparent distress. Eyes: PERRLA, EOMs, conjunctiva no swelling or erythema Sinuses: No Frontal/maxillary tenderness ENT/Mouth: Ext aud canals clear, TMs without  erythema, bulging. No erythema, swelling, or exudate on post pharynx.  Tonsils not swollen or erythematous. Hearing normal.  Neck: Supple, thyroid normal.  Respiratory: Respiratory effort normal, BS equal bilaterally without rales, rhonchi, wheezing or stridor.  Cardio: RRR with no MRGs. Brisk peripheral pulses without edema.  Abdomen: Soft, + BS.  Non tender, no guarding, rebound, hernias, masses. Lymphatics: Non tender without lymphadenopathy.  Musculoskeletal: Full ROM, 5/5 strength, Normal gait Skin: Warm, dry without rashes, lesions, ecchymosis.  Neuro: Cranial nerves intact. No cerebellar symptoms.  Psych: Awake and oriented X 3, normal affect, Insight and Judgment appropriate.    Darrol Jump, NP 9:43 AM [facility]

## 2022-10-16 LAB — CBC WITH DIFFERENTIAL/PLATELET
Absolute Monocytes: 248 cells/uL (ref 200–950)
Basophils Absolute: 48 cells/uL (ref 0–200)
Basophils Relative: 0.7 %
Eosinophils Absolute: 131 cells/uL (ref 15–500)
Eosinophils Relative: 1.9 %
HCT: 37 % (ref 35.0–45.0)
Hemoglobin: 12.3 g/dL (ref 11.7–15.5)
Lymphs Abs: 2360 cells/uL (ref 850–3900)
MCH: 29.4 pg (ref 27.0–33.0)
MCHC: 33.2 g/dL (ref 32.0–36.0)
MCV: 88.5 fL (ref 80.0–100.0)
MPV: 11 fL (ref 7.5–12.5)
Monocytes Relative: 3.6 %
Neutro Abs: 4112 cells/uL (ref 1500–7800)
Neutrophils Relative %: 59.6 %
Platelets: 258 10*3/uL (ref 140–400)
RBC: 4.18 10*6/uL (ref 3.80–5.10)
RDW: 11.7 % (ref 11.0–15.0)
Total Lymphocyte: 34.2 %
WBC: 6.9 10*3/uL (ref 3.8–10.8)

## 2022-10-16 LAB — COMPLETE METABOLIC PANEL WITH GFR
AG Ratio: 1.2 (calc) (ref 1.0–2.5)
ALT: 27 U/L (ref 6–29)
AST: 21 U/L (ref 10–35)
Albumin: 3.8 g/dL (ref 3.6–5.1)
Alkaline phosphatase (APISO): 124 U/L (ref 37–153)
BUN: 16 mg/dL (ref 7–25)
CO2: 27 mmol/L (ref 20–32)
Calcium: 9.3 mg/dL (ref 8.6–10.4)
Chloride: 107 mmol/L (ref 98–110)
Creat: 0.86 mg/dL (ref 0.50–1.05)
Globulin: 3.3 g/dL (calc) (ref 1.9–3.7)
Glucose, Bld: 89 mg/dL (ref 65–99)
Potassium: 4.3 mmol/L (ref 3.5–5.3)
Sodium: 142 mmol/L (ref 135–146)
Total Bilirubin: 0.5 mg/dL (ref 0.2–1.2)
Total Protein: 7.1 g/dL (ref 6.1–8.1)
eGFR: 74 mL/min/{1.73_m2} (ref >=60)

## 2022-10-16 LAB — LIPID PANEL
Cholesterol: 193 mg/dL (ref <200)
HDL: 50 mg/dL (ref >=50)
LDL Cholesterol (Calc): 118 mg/dL (calc) — ABNORMAL HIGH
Non-HDL Cholesterol (Calc): 143 mg/dL (calc) — ABNORMAL HIGH (ref <130)
Total CHOL/HDL Ratio: 3.9 (calc) (ref <5.0)
Triglycerides: 134 mg/dL (ref <150)

## 2022-10-16 LAB — VITAMIN D 25 HYDROXY (VIT D DEFICIENCY, FRACTURES): Vit D, 25-Hydroxy: 24 ng/mL — ABNORMAL LOW (ref 30–100)

## 2022-11-04 DIAGNOSIS — H43393 Other vitreous opacities, bilateral: Secondary | ICD-10-CM | POA: Diagnosis not present

## 2022-11-04 DIAGNOSIS — H2513 Age-related nuclear cataract, bilateral: Secondary | ICD-10-CM | POA: Diagnosis not present

## 2022-11-05 DIAGNOSIS — M79674 Pain in right toe(s): Secondary | ICD-10-CM | POA: Diagnosis not present

## 2022-11-05 DIAGNOSIS — M79671 Pain in right foot: Secondary | ICD-10-CM | POA: Diagnosis not present

## 2022-11-17 DIAGNOSIS — M79671 Pain in right foot: Secondary | ICD-10-CM | POA: Diagnosis not present

## 2022-11-26 DIAGNOSIS — S9031XA Contusion of right foot, initial encounter: Secondary | ICD-10-CM | POA: Diagnosis not present

## 2022-12-04 ENCOUNTER — Ambulatory Visit
Admission: RE | Admit: 2022-12-04 | Discharge: 2022-12-04 | Disposition: A | Discharge status: Home / Self Care | Payer: Medicare Other | Source: Ambulatory Visit | Attending: Internal Medicine | Admitting: Internal Medicine

## 2022-12-04 DIAGNOSIS — Z1231 Encounter for screening mammogram for malignant neoplasm of breast: Secondary | ICD-10-CM | POA: Diagnosis not present

## 2022-12-21 IMAGING — MG MM DIGITAL SCREENING BILAT W/ TOMO AND CAD
8 series · 8 of 24 positions shown · non-contrast
Comparison: Previous exam(s).

CLINICAL DATA: Screening.

EXAM:
DIGITAL SCREENING BILATERAL MAMMOGRAM WITH TOMOSYNTHESIS AND CAD
TECHNIQUE: Bilateral screening digital craniocaudal and mediolateral oblique
mammograms were obtained. Bilateral screening digital breast
tomosynthesis was performed. The images were evaluated with
computer-aided detection.

[L CC synth-2D]
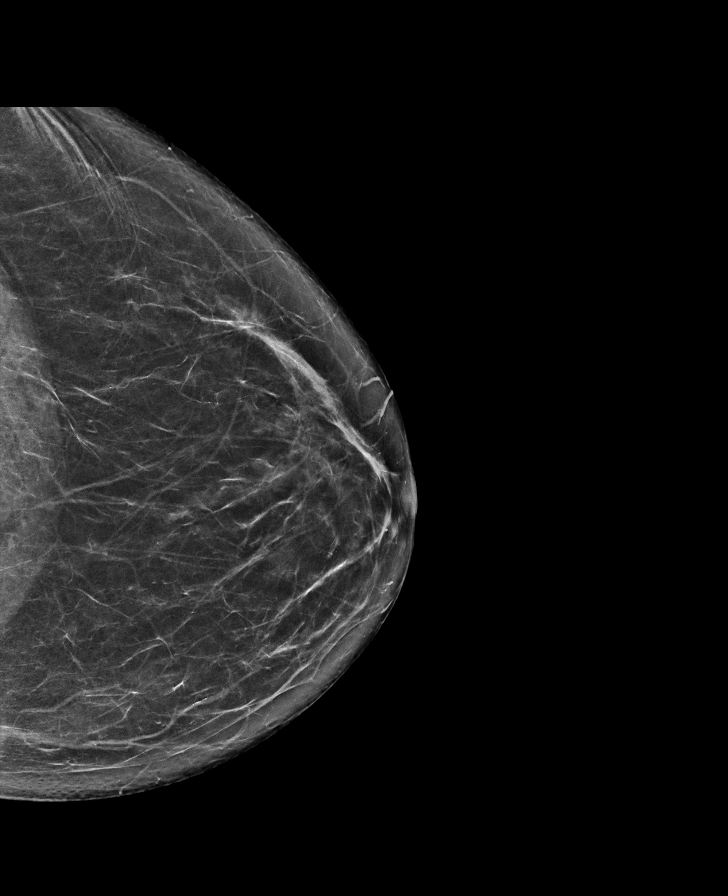

[R MLO synth-2D]
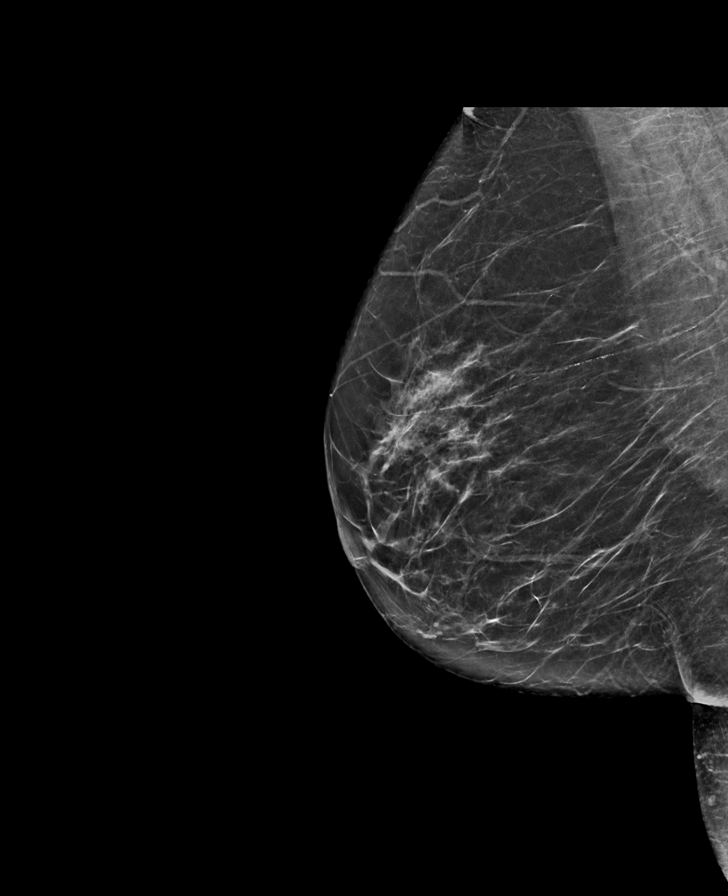

[L MLO synth-2D]
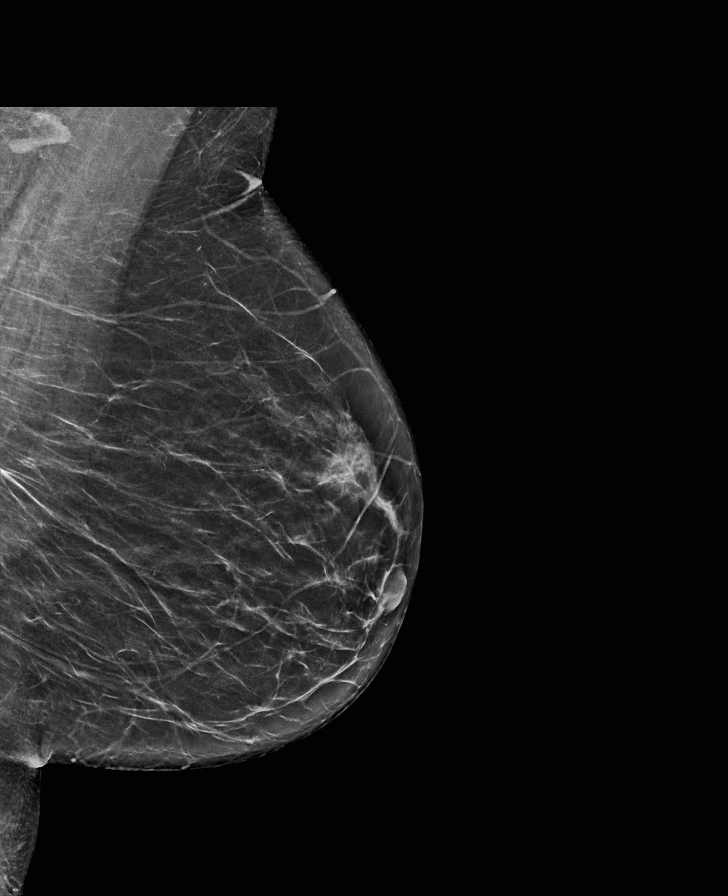

[R CC synth-2D]
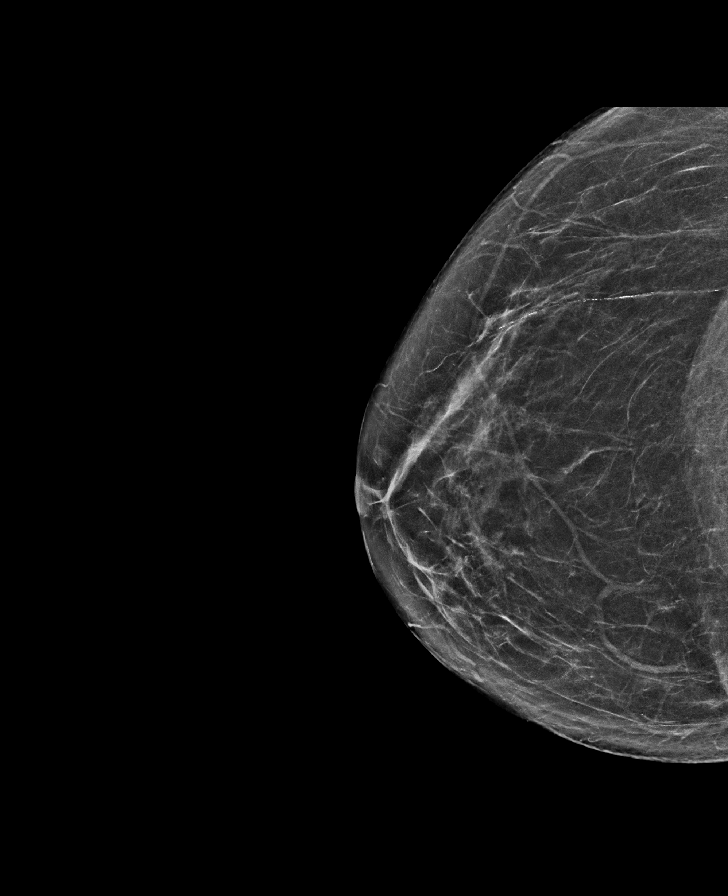

[R MLO tomo · tomo slice 35/70.0]
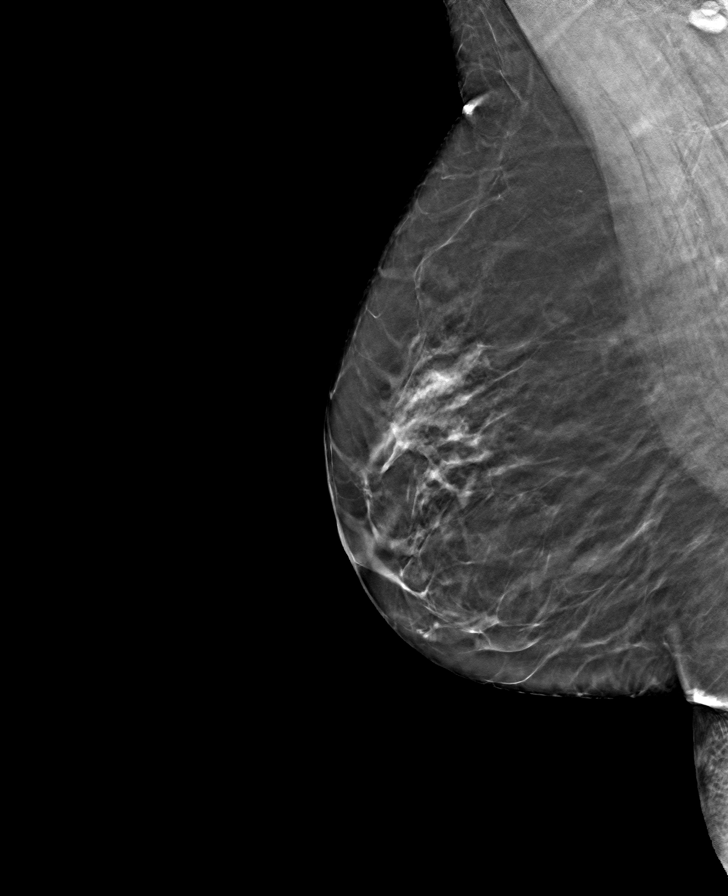

[L CC tomo · tomo slice 35/69.0]
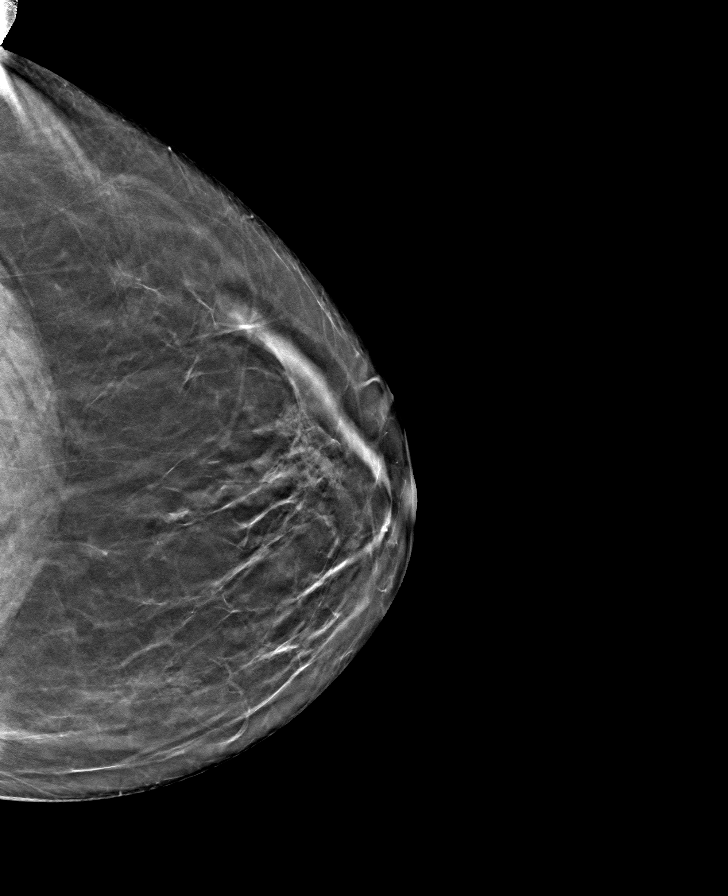

[R CC tomo · tomo slice 35/68.0]
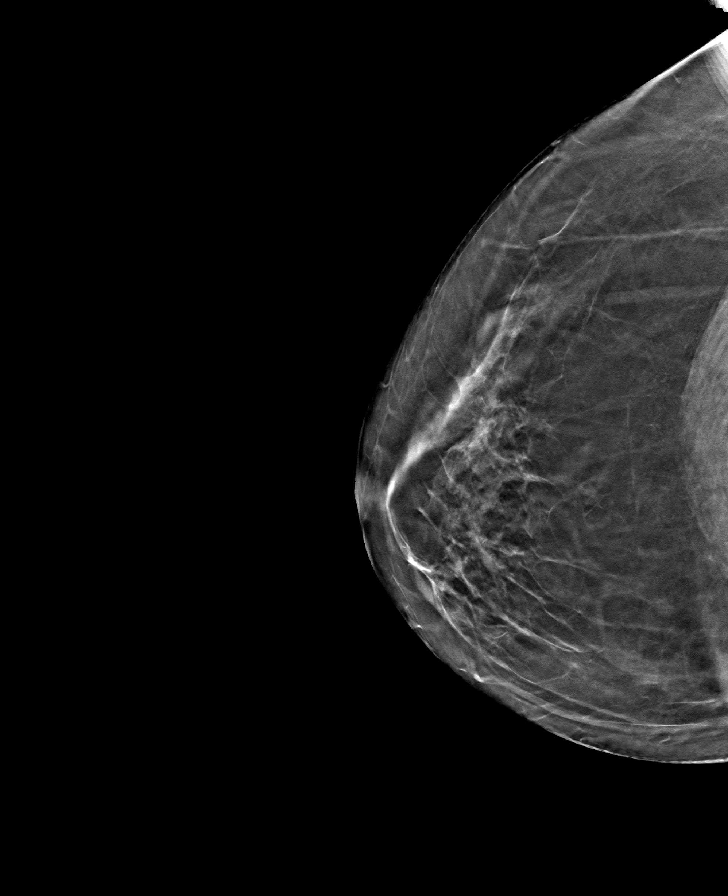

[L MLO tomo · tomo slice 37/72.0]
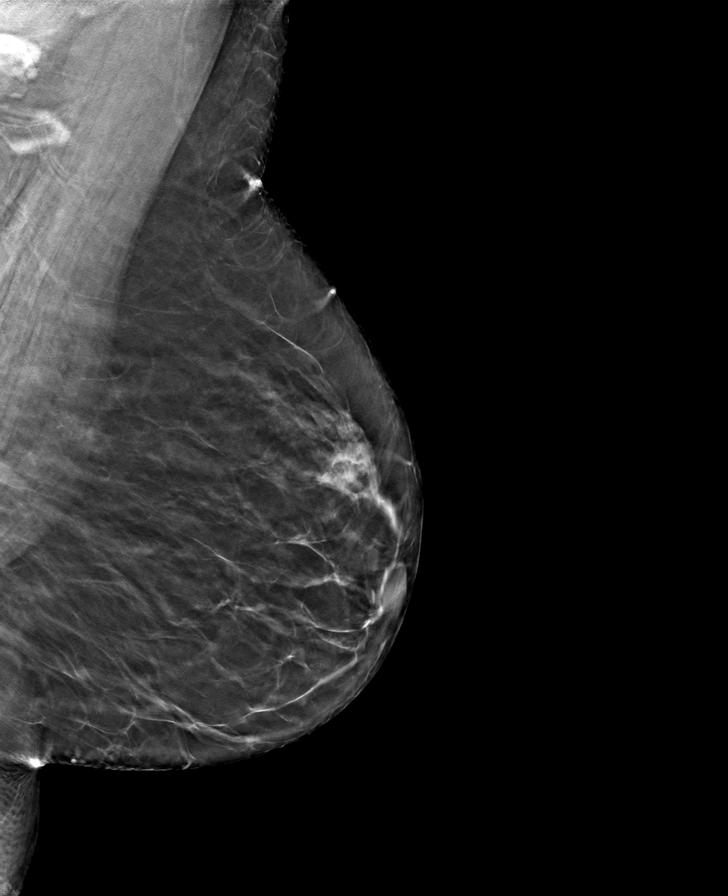

[8 of 24 positions shown; findings below may reference images not displayed]

ACR Breast Density Category b: There are scattered areas of
fibroglandular density.
FINDINGS: There are no findings suspicious for malignancy.
IMPRESSION: No mammographic evidence of malignancy. A result letter of this
screening mammogram will be mailed directly to the patient.

RECOMMENDATION:
Screening mammogram in one year. (Code:51-O-LD2)

BI-RADS CATEGORY  1: Negative.

## 2023-01-29 ENCOUNTER — Encounter: Payer: Self-pay | Admitting: Nurse Practitioner

## 2023-01-29 ENCOUNTER — Ambulatory Visit (INDEPENDENT_AMBULATORY_CARE_PROVIDER_SITE_OTHER): Payer: Medicare Other | Admitting: Nurse Practitioner

## 2023-01-29 VITALS — BP 158/82 | HR 70 | Temp 97.5°F | Ht 68.0 in | Wt 182.4 lb

## 2023-01-29 DIAGNOSIS — E559 Vitamin D deficiency, unspecified: Secondary | ICD-10-CM | POA: Diagnosis not present

## 2023-01-29 DIAGNOSIS — Z0000 Encounter for general adult medical examination without abnormal findings: Secondary | ICD-10-CM

## 2023-01-29 DIAGNOSIS — F419 Anxiety disorder, unspecified: Secondary | ICD-10-CM | POA: Diagnosis not present

## 2023-01-29 DIAGNOSIS — I10 Essential (primary) hypertension: Secondary | ICD-10-CM | POA: Diagnosis not present

## 2023-01-29 DIAGNOSIS — Z13820 Encounter for screening for osteoporosis: Secondary | ICD-10-CM | POA: Diagnosis not present

## 2023-01-29 DIAGNOSIS — Z79899 Other long term (current) drug therapy: Secondary | ICD-10-CM | POA: Diagnosis not present

## 2023-01-29 DIAGNOSIS — Z0001 Encounter for general adult medical examination with abnormal findings: Secondary | ICD-10-CM | POA: Diagnosis not present

## 2023-01-29 DIAGNOSIS — E782 Mixed hyperlipidemia: Secondary | ICD-10-CM

## 2023-01-29 DIAGNOSIS — R6889 Other general symptoms and signs: Secondary | ICD-10-CM | POA: Diagnosis not present

## 2023-01-29 DIAGNOSIS — E663 Overweight: Secondary | ICD-10-CM | POA: Insufficient documentation

## 2023-01-29 NOTE — Progress Notes (Signed)
WELCOME TO MEDICARE  WELLNESS VISIT AND FOLLOW UP  Assessment:   [name] was seen today for a follow up.  Diagnosis and Plan of Care are as follows:  Welcome to Medicare Due annually Health maintenance reviewed  Hypertension, unspecified type Well controlled at home upon review of BP log; <130/80. Elevated in clinic - asymptomatic Continue Olmesartan  Discussed DASH (Dietary Approaches to Stop Hypertension) DASH diet is lower in sodium than a typical American diet. Cut back on foods that are high in saturated fat, cholesterol, and trans fats. Eat more whole-grain foods, fish, poultry, and nuts Remain active and exercise as tolerated daily.  Monitor BP at home-Call if greater than 130/80.  Check CMP/CBC  - CBC with Differential/Platelet - COMPLETE METABOLIC PANEL WITH GFR  Mixed hyperlipidemia Discussed lifestyle modifications. Recommended diet heavy in fruits and veggies, omega 3's. Decrease consumption of animal meats, cheeses, and dairy products. Remain active and exercise as tolerated. Continue to monitor. Check lipids/TSH  - Lipid panel  Anxiety Continue Buspar.   Reviewed relaxation techniques.  Sleep hygiene. Recommended Cognitive Behavioral Therapy (CBT). Recommended mindfulness meditation and exercise.   Encouraged personality growth wand development through coping techniques and problem-solving skills. Limit/Decrease/Monitor drug/alcohol intake.    Vitamin D deficiency Continue supplement for goal of 60-100 Monitor Vitamin D levels  - VITAMIN D 25 Hydroxy (Vit-D Deficiency, Fractures)  Overweight Reducing weight properly Discussed appropriate BMI Continue diet modification and lifestyle modification. Remain active Encouraged/praised to build confidence.   Medication management All medications discussed and reviewed in full. All questions and concerns regarding medications addressed.    - CBC with Differential/Platelet - COMPLETE METABOLIC PANEL  WITH GFR - Lipid panel - VITAMIN D 25 Hydroxy (Vit-D Deficiency, Fractures)  Screening for osteoporosis Continue Alendronate Pursue a combination of weight-bearing exercises and strength training. Advised on fall prevention measures including proper lighting in all rooms, removal of area rugs and floor clutter, use of walking devices as deemed appropriate, avoidance of uneven walking surfaces. Smoking cessation and moderate alcohol consumption if applicable Consume 800 to 1000 IU of vitamin D daily with a goal vitamin D serum value of 30 ng/mL or higher. Aim for 1000 to 1200 mg of elemental calcium daily through supplements and/or dietary sources.  - DG Bone Density; Future   Notify office for further evaluation and treatment, questions or concerns if any reported s/s fail to improve.   The patient was advised to call back or seek an in-person evaluation if any symptoms worsen or if the condition fails to improve as anticipated.   Further disposition pending results of labs. Discussed med's effects and SE's.    I discussed the assessment and treatment plan with the patient. The patient was provided an opportunity to ask questions and all were answered. The patient agreed with the plan and demonstrated an understanding of the instructions.  Discussed med's effects and SE's. Screening labs and tests as requested with regular follow-up as recommended.  I provided 35 minutes of face-to-face time during this encounter including counseling, chart review, and critical decision making was preformed.  Today's Plan of Care is based on a patient-centered health care approach known as shared decision making - the decisions, tests and treatments allow for patient preferences and values to be balanced with clinical evidence.    Future Appointments  Date Time Provider Department Center  01/29/2024  9:00 AM Adela Glimpse, NP [facility] None     Plan:   During the course of the visit the patient  was educated and counseled about appropriate screening and preventive services including:   Pneumococcal vaccine  Prevnar 13 Influenza vaccine Td vaccine Screening electrocardiogram Bone densitometry screening Colorectal cancer screening Diabetes screening Glaucoma screening Nutrition counseling  Advanced directives: requested   Subjective:  Holly Mclean is a 67 y.o. female who presents for Medicare Annual Wellness Visit and 3 month follow up.   Overall she reports feeling well.  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  She is looking forward to spending more time with family and grandchildren.  She has a cruise planned for 05/2023.    BMI is Body mass index is 27.73 kg/m., she has been working on diet and exercise. Wt Readings from Last 3 Encounters:  01/29/23 182 lb 6.4 oz (82.7 kg)  10/15/22 190 lb (86.2 kg)  06/10/22 188 lb 6.4 oz (85.5 kg)    Her blood pressure has been controlled at home, today their BP is BP: (!) 158/82.  She tends to be elevated in clinic however review of home BP log reveals average BP <130/80. She does workout. She denies chest pain, shortness of breath, dizziness.   BP Readings from Last 3 Encounters:  01/29/23 (!) 158/82  10/15/22 (!) 150/90  06/10/22 (!) 150/90    She is not on cholesterol medication. Her cholesterol is not at goal. The cholesterol last visit was:   Lab Results  Component Value Date   CHOL 193 10/15/2022   HDL 50 10/15/2022   LDLCALC 118 (H) 10/15/2022   TRIG 134 10/15/2022   CHOLHDL 3.9 10/15/2022   She has been working on diet and exercise for any prediabetes, and denies hyperglycemia, hypoglycemia , polydipsia, and polyuria. Last A1C in the office was:  Lab Results  Component Value Date   HGBA1C 5.3 04/02/2022   Last GFR: Lab Results  Component Value Date   EGFR 74 10/15/2022   Patient is on Vitamin D supplement.   Lab Results  Component Value Date   VD25OH 24 (L) 10/15/2022      Medication  Review: Current Outpatient Medications on File Prior to Visit  Medication Sig Dispense Refill   albuterol (VENTOLIN HFA) 108 (90 Base) MCG/ACT inhaler Inhale 2 puffs into the lungs every 6 (six) hours as needed for wheezing or shortness of breath. 1 each 2   atorvastatin (LIPITOR) 10 MG tablet Take 1 tablet (10 mg total) by mouth daily. 30 tablet 11   busPIRone (BUSPAR) 5 MG tablet Take 1 tab (5 mg) HS. 90 tablet 2   ibuprofen (ADVIL) 200 MG tablet Take 200 mg by mouth every 6 (six) hours as needed.     MELATONIN ER PO Take by mouth at bedtime as needed.     olmesartan (BENICAR) 5 MG tablet TAKE 1 TABLET (5 MG TOTAL) BY MOUTH DAILY. 90 tablet 1   ondansetron (ZOFRAN-ODT) 4 MG disintegrating tablet Take 1 tablet (4 mg total) by mouth every 8 (eight) hours as needed for nausea or vomiting. 20 tablet 0   alendronate (FOSAMAX) 70 MG tablet Take 1 tablet (70 mg total) by mouth every 7 (seven) days. Take with a full glass of water on an empty stomach. (Patient not taking: Reported on 09/25/2022) 4 tablet 11   dexamethasone (DECADRON) 1 MG tablet Take 3 tabs for 3 days, 2 tabs for 3 days 1 tab for 5 days. Take with food. (Patient not taking: Reported on 01/29/2023) 20 tablet 0   promethazine-dextromethorphan (PROMETHAZINE-DM) 6.25-15 MG/5ML syrup Take 5 mLs by  mouth 4 (four) times daily as needed for cough. (Patient not taking: Reported on 01/29/2023) 240 mL 1   Semaglutide-Weight Management (WEGOVY) 0.25 MG/0.5ML SOAJ Inject 0.25 mg into the skin once a week. (Patient not taking: Reported on 06/10/2022) 2 mL 0   No current facility-administered medications on file prior to visit.    No Known Allergies  Current Problems (verified) Patient Active Problem List   Diagnosis Date Noted   Pneumonia 01/05/2014    Screening Tests Immunization History  Administered Date(s) Administered   PPD Test 04/11/2014   Tdap 04/11/2014   Health Maintenance  Topic Date Due   COVID-19 Vaccine (1) Never done    Hepatitis C Screening  Never done   Zoster Vaccines- Shingrix (1 of 2) Never done   Pneumonia Vaccine 19+ Years old (1 of 1 - PCV) Never done   INFLUENZA VACCINE  05/15/2023   Medicare Annual Wellness (AWV)  01/29/2024   DTaP/Tdap/Td (2 - Td or Tdap) 04/11/2024   COLONOSCOPY (Pts 45-76yrs Insurance coverage will need to be confirmed)  07/15/2024   MAMMOGRAM  12/04/2024   DEXA SCAN  Completed   HPV VACCINES  Aged Out    Last colonoscopy: 07/2014 - 10 year recall Due 07/2024 Mammograms: 11/2022 Last pap smear/pelvic exam: 04/2014   DEXA: 04/2014 -0.9 Normal - Due   Names of Other Physician/Practitioners you currently use: 1. [facility] here for primary care 2. [facility], eye doctor, last visit 2024 3. Searching new one, dentist, last visit 2023  Patient Care Team: Lucky Cowboy, MD as PCP - General (Internal Medicine) Campbell Stall, MD as Consulting Physician (Dermatology)  SURGICAL HISTORY She  has a past surgical history that includes Umbilical hernia repair (1966); Appendectomy (1968); Tonsillectomy (1975); and Breast biopsy (Left, 1995). FAMILY HISTORY Her family history includes Heart disease in her father, maternal grandfather, and paternal grandfather; Hyperlipidemia in her father; Hypertension in her father; Lupus in her maternal grandmother. SOCIAL HISTORY She  reports that she has never smoked. She has never used smokeless tobacco. She reports that she does not drink alcohol and does not use drugs.   MEDICARE WELLNESS OBJECTIVES: Physical activity: Current Exercise Habits: Home exercise routine Cardiac risk factors:   Depression/mood screen:      01/29/2023    9:59 AM  Depression screen PHQ 2/9  Decreased Interest 0  Down, Depressed, Hopeless 0  PHQ - 2 Score 0    ADLs:     01/29/2023    9:56 AM  In your present state of health, do you have any difficulty performing the following activities:  Hearing? 0  Vision? 0   Difficulty concentrating or making decisions? 0  Walking or climbing stairs? 0  Dressing or bathing? 0  Doing errands, shopping? 0  Preparing Food and eating ? N  Using the Toilet? N  In the past six months, have you accidently leaked urine? N  Do you have problems with loss of bowel control? N  Managing your Medications? N  Managing your Finances? N  Housekeeping or managing your Housekeeping? N     Cognitive Testing  Alert? Yes  Normal Appearance?Yes  Oriented to person? Yes  Place? Yes   Time? Yes  Recall of three objects?  Yes  Can perform simple calculations? Yes  Displays appropriate judgment?Yes  Can read the correct time from a watch face?Yes  EOL planning: Does Patient Have a Medical Advance Directive?: No  ROS   Objective:  Today's Vitals   01/29/23 0923  BP: (!) 158/82  Pulse: 70  Temp: (!) 97.5 F (36.4 C)  SpO2: 99%  Weight: 182 lb 6.4 oz (82.7 kg)  Height: 5\' 8"  (1.727 m)   Body mass index is 27.73 kg/m.  General appearance: alert, no distress, WD/WN, female HEENT: normocephalic, sclerae anicteric, TMs pearly, nares patent, no discharge or erythema, pharynx normal Oral cavity: MMM, no lesions Neck: supple, no lymphadenopathy, no thyromegaly, no masses Heart: RRR, normal S1, S2, no murmurs Lungs: CTA bilaterally, no wheezes, rhonchi, or rales Abdomen: +bs, soft, non tender, non distended, no masses, no hepatomegaly, no splenomegaly Musculoskeletal: nontender, no swelling, no obvious deformity Extremities: no edema, no cyanosis, no clubbing Pulses: 2+ symmetric, upper and lower extremities, normal cap refill Neurological: alert, oriented x 3, CN2-12 intact, strength normal upper extremities and lower extremities, sensation normal throughout, DTRs 2+ throughout, no cerebellar signs, gait normal Psychiatric: normal affect, behavior normal, pleasant   Medicare Attestation I have personally reviewed: The patient's medical and social history Their  use of alcohol, tobacco or illicit drugs Their current medications and supplements The patient's functional ability including ADLs,fall risks, home safety risks, cognitive, and hearing and visual impairment Diet and physical activities Evidence for depression or mood disorders  The patient's weight, height, BMI, and visual acuity have been recorded in the chart.  I have made referrals, counseling, and provided education to the patient based on review of the above and I have provided the patient with a written personalized care plan for preventive services.     Adela Glimpse, NP   01/29/2023

## 2023-01-29 NOTE — Patient Instructions (Signed)

## 2023-01-30 ENCOUNTER — Other Ambulatory Visit: Payer: Self-pay | Admitting: Nurse Practitioner

## 2023-01-30 DIAGNOSIS — N179 Acute kidney failure, unspecified: Secondary | ICD-10-CM

## 2023-01-30 LAB — LIPID PANEL
Cholesterol: 166 mg/dL (ref <200)
HDL: 48 mg/dL — ABNORMAL LOW (ref >=50)
LDL Cholesterol (Calc): 96 mg/dL (calc)
Non-HDL Cholesterol (Calc): 118 mg/dL (calc) (ref <130)
Total CHOL/HDL Ratio: 3.5 (calc) (ref <5.0)
Triglycerides: 122 mg/dL (ref <150)

## 2023-01-30 LAB — CBC WITH DIFFERENTIAL/PLATELET
Absolute Monocytes: 291 {cells}/uL (ref 200–950)
Basophils Absolute: 81 {cells}/uL (ref 0–200)
Basophils Relative: 1.3 %
Eosinophils Absolute: 211 {cells}/uL (ref 15–500)
Eosinophils Relative: 3.4 %
HCT: 40 % (ref 35.0–45.0)
Hemoglobin: 12.8 g/dL (ref 11.7–15.5)
Lymphs Abs: 2430 {cells}/uL (ref 850–3900)
MCH: 29.1 pg (ref 27.0–33.0)
MCHC: 32 g/dL (ref 32.0–36.0)
MCV: 90.9 fL (ref 80.0–100.0)
MPV: 11.4 fL (ref 7.5–12.5)
Monocytes Relative: 4.7 %
Neutro Abs: 3187 {cells}/uL (ref 1500–7800)
Neutrophils Relative %: 51.4 %
Platelets: 224 10*3/uL (ref 140–400)
RBC: 4.4 Million/uL (ref 3.80–5.10)
RDW: 12.2 % (ref 11.0–15.0)
Total Lymphocyte: 39.2 %
WBC: 6.2 10*3/uL (ref 3.8–10.8)

## 2023-01-30 LAB — COMPLETE METABOLIC PANEL WITH GFR
AG Ratio: 1.5 (calc) (ref 1.0–2.5)
ALT: 9 U/L (ref 6–29)
AST: 13 U/L (ref 10–35)
Albumin: 4.2 g/dL (ref 3.6–5.1)
Alkaline phosphatase (APISO): 88 U/L (ref 37–153)
BUN/Creatinine Ratio: 16 (calc) (ref 6–22)
BUN: 17 mg/dL (ref 7–25)
CO2: 27 mmol/L (ref 20–32)
Calcium: 9.9 mg/dL (ref 8.6–10.4)
Chloride: 105 mmol/L (ref 98–110)
Creat: 1.07 mg/dL — ABNORMAL HIGH (ref 0.50–1.05)
Globulin: 2.8 g/dL (calc) (ref 1.9–3.7)
Glucose, Bld: 88 mg/dL (ref 65–99)
Potassium: 4.4 mmol/L (ref 3.5–5.3)
Sodium: 141 mmol/L (ref 135–146)
Total Bilirubin: 0.4 mg/dL (ref 0.2–1.2)
Total Protein: 7 g/dL (ref 6.1–8.1)
eGFR: 57 mL/min/{1.73_m2} — ABNORMAL LOW (ref >=60)

## 2023-01-30 LAB — VITAMIN D 25 HYDROXY (VIT D DEFICIENCY, FRACTURES): Vit D, 25-Hydroxy: 35 ng/mL (ref 30–100)

## 2023-02-26 ENCOUNTER — Other Ambulatory Visit: Payer: Self-pay | Admitting: Nurse Practitioner

## 2023-04-03 ENCOUNTER — Ambulatory Visit: Payer: Medicare Other | Admitting: Nurse Practitioner

## 2023-04-30 ENCOUNTER — Ambulatory Visit: Payer: Medicare Other | Admitting: Nurse Practitioner

## 2023-05-08 ENCOUNTER — Ambulatory Visit: Payer: Medicare Other | Admitting: Nurse Practitioner

## 2023-06-10 ENCOUNTER — Ambulatory Visit: Payer: Medicare Other | Admitting: Nurse Practitioner

## 2023-09-15 ENCOUNTER — Telehealth: Payer: Self-pay

## 2023-09-15 NOTE — Telephone Encounter (Signed)
LVM for patient to return call. I am inquiring on recent blood pressure readings.

## 2024-01-29 ENCOUNTER — Ambulatory Visit: Payer: Medicare Other | Admitting: Nurse Practitioner

## 2024-12-21 ENCOUNTER — Ambulatory Visit: Admitting: Family Medicine
